# Patient Record
Sex: Male | Born: 1960 | Race: White | Hispanic: No | Marital: Married | Smoking: Former smoker
Health system: Southern US, Community
[De-identification: ages and names within clinical notes are randomized; demographics above are authoritative.]

## PROBLEM LIST (undated history)

## (undated) DIAGNOSIS — I10 Essential (primary) hypertension: Secondary | ICD-10-CM

## (undated) HISTORY — PX: REPAIR OF PERFORATED ULCER: SHX6065

## (undated) HISTORY — DX: Essential (primary) hypertension: I10

---

## 2012-09-11 ENCOUNTER — Ambulatory Visit: Payer: Self-pay | Admitting: Orthopedic Surgery

## 2014-07-04 DIAGNOSIS — E782 Mixed hyperlipidemia: Secondary | ICD-10-CM | POA: Insufficient documentation

## 2014-07-04 DIAGNOSIS — I10 Essential (primary) hypertension: Secondary | ICD-10-CM | POA: Insufficient documentation

## 2015-06-05 ENCOUNTER — Encounter: Payer: Self-pay | Admitting: Family Medicine

## 2015-06-05 ENCOUNTER — Ambulatory Visit (INDEPENDENT_AMBULATORY_CARE_PROVIDER_SITE_OTHER): Payer: BLUE CROSS/BLUE SHIELD | Admitting: Family Medicine

## 2015-06-05 VITALS — BP 142/80 | HR 88 | Temp 102.4°F | Ht 71.0 in | Wt 204.0 lb

## 2015-06-05 DIAGNOSIS — J029 Acute pharyngitis, unspecified: Secondary | ICD-10-CM

## 2015-06-05 LAB — POCT INFLUENZA A/B
Influenza A, POC: NEGATIVE
Influenza B, POC: NEGATIVE

## 2015-06-05 LAB — POCT RAPID STREP A (OFFICE): Rapid Strep A Screen: NEGATIVE

## 2015-06-05 MED ORDER — AMOXICILLIN-POT CLAVULANATE 875-125 MG PO TABS: 1.0000 | ORAL_TABLET | Freq: Two times a day (BID) | ORAL | Status: DC

## 2015-06-05 NOTE — Progress Notes (Signed)
Name: Derek Steele   MRN: [id number]    DOB: [birth year]   Date:06/05/2015       Progress Note  Subjective  Chief Complaint  Chief Complaint  Patient presents with  . Sore Throat    headache, fever- 101.2, chills, body aches x 2 days- can't eat, nauseated    Sore Throat  This is a new problem. The current episode started in the past 7 days. The problem has been waxing and waning. The maximum temperature recorded prior to his arrival was 101 - 101.9 F. Associated symptoms include congestion, coughing, headaches, a hoarse voice, neck pain, swollen glands and vomiting. Pertinent negatives include no abdominal pain, diarrhea, ear discharge, ear pain, shortness of breath or trouble swallowing. He has had no exposure to strep. He has tried acetaminophen for the symptoms. The treatment provided no relief.    No problem-specific assessment & plan notes found for this encounter.   No past medical history on file.  No past surgical history on file.  No family history on file.  Social History   Social History  . Marital Status: Married    Spouse Name: N/A  . Number of Children: N/A  . Years of Education: N/A   Occupational History  . Not on file.   Social History Main Topics  . Smoking status: Former Research scientist (life sciences)  . Smokeless tobacco: Not on file  . Alcohol Use: No  . Drug Use: No  . Sexual Activity: Not on file   Other Topics Concern  . Not on file   Social History Narrative  . No narrative on file    No Known Allergies   Review of Systems  Constitutional: Negative for fever, chills, weight loss and malaise/fatigue.  HENT: Positive for congestion, hoarse voice and sore throat. Negative for ear discharge, ear pain and trouble swallowing.   Eyes: Negative for blurred vision.  Respiratory: Positive for cough. Negative for sputum production, shortness of breath and wheezing.   Cardiovascular: Negative for chest pain, palpitations and leg swelling.  Gastrointestinal: Positive  for vomiting. Negative for heartburn, nausea, abdominal pain, diarrhea, constipation, blood in stool and melena.  Genitourinary: Negative for dysuria, urgency, frequency and hematuria.  Musculoskeletal: Positive for neck pain. Negative for myalgias, back pain and joint pain.  Skin: Negative for rash.  Neurological: Positive for headaches. Negative for dizziness, tingling, sensory change and focal weakness.  Endo/Heme/Allergies: Negative for environmental allergies and polydipsia. Does not bruise/bleed easily.  Psychiatric/Behavioral: Negative for depression and suicidal ideas. The patient is not nervous/anxious and does not have insomnia.      Objective  Filed Vitals:   06/05/15 0928  BP: 142/80  Pulse: 88  Temp: 102.4 F (39.1 C)  TempSrc: Oral  Weight: 204 lb (92.534 kg)    Physical Exam  Constitutional: He is oriented to person, place, and time and well-developed, well-nourished, and in no distress.  HENT:  Head: Normocephalic.  Right Ear: External ear normal.  Left Ear: External ear normal.  Nose: Nose normal.  Mouth/Throat: Oropharynx is clear and moist.  Eyes: Conjunctivae and EOM are normal. Pupils are equal, round, and reactive to light. Right eye exhibits no discharge. Left eye exhibits no discharge. No scleral icterus.  Neck: Normal range of motion. Neck supple. No JVD present. No tracheal deviation present. No thyromegaly present.  Cardiovascular: Normal rate, regular rhythm, normal heart sounds and intact distal pulses.  Exam reveals no gallop and no friction rub.   No murmur heard. Pulmonary/Chest: Breath sounds normal.  No respiratory distress. He has no wheezes. He has no rales.  Abdominal: Soft. Bowel sounds are normal. He exhibits no mass. There is no hepatosplenomegaly. There is no tenderness. There is no rebound, no guarding and no CVA tenderness.  Musculoskeletal: Normal range of motion. He exhibits no edema or tenderness.  Lymphadenopathy:       Head (right  side): Submental and submandibular adenopathy present.       Head (left side): Submental and submandibular adenopathy present.    He has no cervical adenopathy.  Neurological: He is alert and oriented to person, place, and time. He has normal sensation, normal strength, normal reflexes and intact cranial nerves. No cranial nerve deficit.  Skin: Skin is warm. No rash noted.  Psychiatric: Mood and affect normal.  Nursing note and vitals reviewed.     Assessment & Plan  Problem List Items Addressed This Visit    None    Visit Diagnoses    Pharyngitis    -  Primary    Relevant Medications    amoxicillin-clavulanate (AUGMENTIN) 875-125 MG tablet    Other Relevant Orders    POCT Influenza A/B (Completed)    POCT rapid strep A (Completed)         Dr. [facility]  06/05/2015

## 2015-08-20 ENCOUNTER — Encounter: Payer: Self-pay | Admitting: Family Medicine

## 2015-08-20 ENCOUNTER — Ambulatory Visit (INDEPENDENT_AMBULATORY_CARE_PROVIDER_SITE_OTHER): Payer: BLUE CROSS/BLUE SHIELD | Admitting: Family Medicine

## 2015-08-20 VITALS — BP 130/100 | HR 80 | Temp 98.1°F | Ht 71.0 in | Wt 205.0 lb

## 2015-08-20 DIAGNOSIS — J0100 Acute maxillary sinusitis, unspecified: Secondary | ICD-10-CM | POA: Diagnosis not present

## 2015-08-20 MED ORDER — AMOXICILLIN-POT CLAVULANATE 875-125 MG PO TABS
1.0000 | ORAL_TABLET | Freq: Two times a day (BID) | ORAL | Status: DC
Start: 2015-08-20 — End: 2015-09-22

## 2015-08-20 NOTE — Progress Notes (Signed)
Name: Derek Steele   MRN: [id number]    DOB: 1960-06-30   Date:08/20/2015       Progress Note  Subjective  Chief Complaint  Chief Complaint  Patient presents with  . Sinusitis    headache, sore throat    Sinusitis This is a new problem. The current episode started in the past 7 days. The problem has been waxing and waning since onset. There has been no fever. Pertinent negatives include no chills, congestion, coughing, diaphoresis, ear pain, headaches, hoarse voice, neck pain, shortness of breath, sinus pressure, sneezing, sore throat or swollen glands. Past treatments include acetaminophen.    No problem-specific assessment & plan notes found for this encounter.   Past Medical History  Diagnosis Date  . Hypertension     History reviewed. No pertinent past surgical history.  History reviewed. No pertinent family history.  Social History   Social History  . Marital Status: Married    Spouse Name: N/A  . Number of Children: N/A  . Years of Education: N/A   Occupational History  . Not on file.   Social History Main Topics  . Smoking status: Former Research scientist (life sciences)  . Smokeless tobacco: Not on file  . Alcohol Use: No  . Drug Use: No  . Sexual Activity: Not on file   Other Topics Concern  . Not on file   Social History Narrative    No Known Allergies   Review of Systems  Constitutional: Negative for fever, chills, weight loss, malaise/fatigue and diaphoresis.  HENT: Negative for congestion, ear discharge, ear pain, hoarse voice, sinus pressure, sneezing and sore throat.   Eyes: Negative for blurred vision.  Respiratory: Negative for cough, sputum production, shortness of breath and wheezing.   Cardiovascular: Negative for chest pain, palpitations and leg swelling.  Gastrointestinal: Negative for heartburn, nausea, abdominal pain, diarrhea, constipation, blood in stool and melena.  Genitourinary: Negative for dysuria, urgency, frequency and hematuria.   Musculoskeletal: Negative for myalgias, back pain, joint pain and neck pain.  Skin: Negative for rash.  Neurological: Negative for dizziness, tingling, sensory change, focal weakness and headaches.  Endo/Heme/Allergies: Negative for environmental allergies and polydipsia. Does not bruise/bleed easily.  Psychiatric/Behavioral: Negative for depression and suicidal ideas. The patient is not nervous/anxious and does not have insomnia.      Objective  Filed Vitals:   08/20/15 0827  BP: 130/100  Pulse: 80  Temp: 98.1 F (36.7 C)  TempSrc: Oral  Height: 5\' 11"  (1.803 m)  Weight: 205 lb (92.987 kg)    Physical Exam  Constitutional: He is oriented to person, place, and time and well-developed, well-nourished, and in no distress.  HENT:  Head: Normocephalic.  Right Ear: External ear normal.  Left Ear: External ear normal.  Nose: Right sinus exhibits no maxillary sinus tenderness and no frontal sinus tenderness. Left sinus exhibits maxillary sinus tenderness. Left sinus exhibits no frontal sinus tenderness.  Mouth/Throat: Posterior oropharyngeal erythema present.  Eyes: Conjunctivae and EOM are normal. Pupils are equal, round, and reactive to light. Right eye exhibits no discharge. Left eye exhibits no discharge. No scleral icterus.  Neck: Normal range of motion. Neck supple. No JVD present. No tracheal deviation present. No thyromegaly present.  Cardiovascular: Normal rate, regular rhythm, normal heart sounds and intact distal pulses.  Exam reveals no gallop and no friction rub.   No murmur heard. Pulmonary/Chest: Breath sounds normal. No respiratory distress. He has no wheezes. He has no rales.  Abdominal: Soft. Bowel sounds are normal. He  exhibits no mass. There is no hepatosplenomegaly. There is no tenderness. There is no rebound, no guarding and no CVA tenderness.  Musculoskeletal: Normal range of motion. He exhibits no edema or tenderness.  Lymphadenopathy:    He has no cervical  adenopathy.  Neurological: He is alert and oriented to person, place, and time. He has normal sensation, normal strength, normal reflexes and intact cranial nerves. No cranial nerve deficit.  Skin: Skin is warm. No rash noted.  Psychiatric: Mood and affect normal.  Nursing note and vitals reviewed.     Assessment & Plan  Problem List Items Addressed This Visit    None    Visit Diagnoses    Acute maxillary sinusitis, recurrence not specified    -  Primary    Relevant Medications    amoxicillin-clavulanate (AUGMENTIN) 875-125 MG tablet         Dr. [facility]  08/20/2015

## 2015-08-29 ENCOUNTER — Ambulatory Visit (INDEPENDENT_AMBULATORY_CARE_PROVIDER_SITE_OTHER): Payer: BLUE CROSS/BLUE SHIELD | Admitting: Family Medicine

## 2015-08-29 ENCOUNTER — Encounter: Payer: Self-pay | Admitting: Family Medicine

## 2015-08-29 VITALS — BP 152/100 | HR 82 | Temp 98.1°F | Resp 16 | Ht 71.0 in | Wt 203.0 lb

## 2015-08-29 DIAGNOSIS — Z23 Encounter for immunization: Secondary | ICD-10-CM

## 2015-08-29 DIAGNOSIS — Z0000 Encounter for general adult medical examination without abnormal findings: Secondary | ICD-10-CM | POA: Diagnosis not present

## 2015-08-29 DIAGNOSIS — I10 Essential (primary) hypertension: Secondary | ICD-10-CM | POA: Diagnosis not present

## 2015-08-29 DIAGNOSIS — R351 Nocturia: Secondary | ICD-10-CM

## 2015-08-29 DIAGNOSIS — Z1211 Encounter for screening for malignant neoplasm of colon: Secondary | ICD-10-CM

## 2015-08-29 DIAGNOSIS — R221 Localized swelling, mass and lump, neck: Secondary | ICD-10-CM

## 2015-08-29 DIAGNOSIS — E782 Mixed hyperlipidemia: Secondary | ICD-10-CM

## 2015-08-29 LAB — HEMOCCULT GUIAC POC 1CARD (OFFICE): FECAL OCCULT BLD: POSITIVE — AB

## 2015-08-29 LAB — POCT URINALYSIS DIPSTICK
BILIRUBIN UA: NEGATIVE
GLUCOSE UA: NEGATIVE
KETONES UA: NEGATIVE
Leukocytes, UA: NEGATIVE
NITRITE UA: NEGATIVE
PH UA: 7
Protein, UA: NEGATIVE
RBC UA: NEGATIVE
SPEC GRAV UA: 1.015
Urobilinogen, UA: 0.2

## 2015-08-29 MED ORDER — AMLODIPINE BESYLATE 5 MG PO TABS: 5.0000 mg | ORAL_TABLET | Freq: Every day | ORAL | Status: DC

## 2015-08-29 NOTE — Progress Notes (Signed)
Name: Derek Steele   MRN: [id number]    DOB: 19-Sep-1960   Date:08/29/2015       Progress Note  Subjective  Chief Complaint  Chief Complaint  Patient presents with  . Annual Exam    HPI Comments: Patient presents for annual physical exam.   No problem-specific assessment & plan notes found for this encounter.   Past Medical History  Diagnosis Date  . Hypertension     History reviewed. No pertinent past surgical history.  History reviewed. No pertinent family history.  Social History   Social History  . Marital Status: Married    Spouse Name: N/A  . Number of Children: N/A  . Years of Education: N/A   Occupational History  . Not on file.   Social History Main Topics  . Smoking status: Former Research scientist (life sciences)  . Smokeless tobacco: Not on file  . Alcohol Use: No  . Drug Use: No  . Sexual Activity: Not on file   Other Topics Concern  . Not on file   Social History Narrative    No Known Allergies   Review of Systems  Constitutional: Negative for fever, chills, weight loss and malaise/fatigue.  HENT: Negative for ear discharge, ear pain and sore throat.   Eyes: Negative for blurred vision.  Respiratory: Negative for cough, sputum production, shortness of breath and wheezing.   Cardiovascular: Negative for chest pain, palpitations and leg swelling.  Gastrointestinal: Negative for heartburn, nausea, abdominal pain, diarrhea, constipation, blood in stool and melena.  Genitourinary: Negative for dysuria, urgency, frequency and hematuria.  Musculoskeletal: Negative for myalgias, back pain, joint pain and neck pain.  Skin: Negative for rash.  Neurological: Negative for dizziness, tingling, sensory change, focal weakness and headaches.  Endo/Heme/Allergies: Negative for environmental allergies and polydipsia. Does not bruise/bleed easily.  Psychiatric/Behavioral: Negative for depression and suicidal ideas. The patient is not nervous/anxious and does not have insomnia.       Objective  Filed Vitals:   08/29/15 1011 08/29/15 1045  BP: 148/108 152/100  Pulse: 82   Temp: 98.1 F (36.7 C)   TempSrc: Oral   Resp: 16   Height: 5\' 11"  (1.803 m)   Weight: 203 lb (92.08 kg)     Physical Exam  Constitutional: He is oriented to person, place, and time and well-developed, well-nourished, and in no distress. Vital signs are normal.  HENT:  Head: Normocephalic.  Right Ear: Tympanic membrane, external ear and ear canal normal.  Left Ear: Tympanic membrane, external ear and ear canal normal.  Nose: Nose normal.  Mouth/Throat: Uvula is midline, oropharynx is clear and moist and mucous membranes are normal.  Eyes: Conjunctivae, EOM and lids are normal. Pupils are equal, round, and reactive to light. Right eye exhibits no discharge. Left eye exhibits no discharge. No scleral icterus.  Fundoscopic exam:      The right eye shows no arteriolar narrowing, no AV nicking and no papilledema.       The left eye shows no arteriolar narrowing, no AV nicking and no papilledema.  Neck: Trachea normal and normal range of motion. Neck supple. Normal carotid pulses, no hepatojugular reflux and no JVD present. Carotid bruit is not present. No rigidity. No tracheal deviation, no edema, no erythema and normal range of motion present. No thyroid mass and no thyromegaly present.    Palpable mass right submandibular  Cardiovascular: Normal rate, regular rhythm, S1 normal, S2 normal, normal heart sounds and intact distal pulses.  PMI is not displaced.  Exam  reveals no gallop and no friction rub.   No murmur heard. Pulmonary/Chest: Breath sounds normal. No respiratory distress. He has no wheezes. He has no rales.  Abdominal: Soft. Bowel sounds are normal. He exhibits no mass. There is no hepatosplenomegaly. There is no tenderness. There is no rebound, no guarding and no CVA tenderness.  Musculoskeletal: Normal range of motion. He exhibits no edema or tenderness.  Lymphadenopathy:        Head (right side): No submandibular adenopathy present.       Head (left side): No submandibular adenopathy present.    He has no cervical adenopathy.  Neurological: He is alert and oriented to person, place, and time. He has normal motor skills, normal sensation, normal strength, normal reflexes and intact cranial nerves. No cranial nerve deficit.  Skin: Skin is warm, dry and intact. No rash noted.  Psychiatric: Mood and affect normal.  Nursing note and vitals reviewed.     Assessment & Plan  Problem List Items Addressed This Visit      Cardiovascular and Mediastinum   Essential hypertension   Relevant Medications   amLODipine (NORVASC) 5 MG tablet   Other Relevant Orders   Renal Function Panel   POCT urinalysis dipstick (Completed)     Other   Combined fat and carbohydrate induced hyperlipemia   Relevant Medications   amLODipine (NORVASC) 5 MG tablet   Other Relevant Orders   Lipid Profile   Annual physical exam - Primary   Relevant Orders   Lipid Profile   Renal Function Panel   POCT Occult Blood Stool (Completed)   POCT urinalysis dipstick (Completed)    Other Visit Diagnoses    Nocturia        Relevant Orders    PSA    Colon cancer screening        Relevant Orders    Ambulatory referral to Gastroenterology    Neck mass        Relevant Orders    Ambulatory referral to ENT         Dr. Otilio Miu [facility]  08/29/2015

## 2015-08-30 LAB — LIPID PANEL
Chol/HDL Ratio: 4.6 ratio units (ref 0.0–5.0)
Cholesterol, Total: 208 mg/dL — ABNORMAL HIGH (ref 100–199)
HDL: 45 mg/dL (ref >39)
LDL Calculated: 134 mg/dL — ABNORMAL HIGH (ref 0–99)
Triglycerides: 146 mg/dL (ref 0–149)
VLDL Cholesterol Cal: 29 mg/dL (ref 5–40)

## 2015-08-30 LAB — RENAL FUNCTION PANEL
ALBUMIN: 4.4 g/dL (ref 3.5–5.5)
BUN/Creatinine Ratio: 11 (ref 9–20)
BUN: 10 mg/dL (ref 6–24)
CO2: 25 mmol/L (ref 18–29)
Calcium: 9.1 mg/dL (ref 8.7–10.2)
Chloride: 100 mmol/L (ref 96–106)
Creatinine, Ser: 0.88 mg/dL (ref 0.76–1.27)
GFR calc Af Amer: 113 mL/min/{1.73_m2} (ref >59)
GFR, EST NON AFRICAN AMERICAN: 97 mL/min/{1.73_m2} (ref >59)
GLUCOSE: 78 mg/dL (ref 65–99)
POTASSIUM: 4.2 mmol/L (ref 3.5–5.2)
Phosphorus: 3.3 mg/dL (ref 2.5–4.5)
Sodium: 142 mmol/L (ref 134–144)

## 2015-08-30 LAB — PSA: Prostate Specific Ag, Serum: 0.7 ng/mL (ref 0.0–4.0)

## 2015-09-05 ENCOUNTER — Other Ambulatory Visit: Payer: Self-pay | Admitting: Otolaryngology

## 2015-09-05 DIAGNOSIS — R221 Localized swelling, mass and lump, neck: Secondary | ICD-10-CM

## 2015-09-05 DIAGNOSIS — D170 Benign lipomatous neoplasm of skin and subcutaneous tissue of head, face and neck: Secondary | ICD-10-CM | POA: Diagnosis not present

## 2015-09-10 ENCOUNTER — Ambulatory Visit: Payer: Self-pay

## 2015-09-14 DIAGNOSIS — I10 Essential (primary) hypertension: Secondary | ICD-10-CM | POA: Diagnosis not present

## 2015-09-14 DIAGNOSIS — J029 Acute pharyngitis, unspecified: Secondary | ICD-10-CM | POA: Diagnosis not present

## 2015-09-15 ENCOUNTER — Ambulatory Visit
Admission: RE | Admit: 2015-09-15 | Discharge: 2015-09-15 | Disposition: A | Discharge status: Home / Self Care | Payer: BLUE CROSS/BLUE SHIELD | Source: Ambulatory Visit | Attending: Otolaryngology | Admitting: Otolaryngology

## 2015-09-15 DIAGNOSIS — R221 Localized swelling, mass and lump, neck: Secondary | ICD-10-CM | POA: Diagnosis not present

## 2015-09-21 DIAGNOSIS — K1370 Unspecified lesions of oral mucosa: Secondary | ICD-10-CM | POA: Diagnosis not present

## 2015-09-22 ENCOUNTER — Encounter: Payer: Self-pay | Admitting: Family Medicine

## 2015-09-22 ENCOUNTER — Ambulatory Visit (INDEPENDENT_AMBULATORY_CARE_PROVIDER_SITE_OTHER): Payer: BLUE CROSS/BLUE SHIELD | Admitting: Family Medicine

## 2015-09-22 VITALS — BP 130/100 | HR 70 | Ht 71.0 in | Wt 207.0 lb

## 2015-09-22 DIAGNOSIS — K121 Other forms of stomatitis: Secondary | ICD-10-CM | POA: Diagnosis not present

## 2015-09-22 MED ORDER — FLUCONAZOLE 150 MG PO TABS: 150.0000 mg | ORAL_TABLET | Freq: Once | ORAL | Status: DC

## 2015-09-22 MED ORDER — VALACYCLOVIR HCL 1 G PO TABS: 1000.0000 mg | ORAL_TABLET | Freq: Two times a day (BID) | ORAL | Status: DC

## 2015-09-22 NOTE — Progress Notes (Signed)
Name: Derek Steele   MRN: [id number]    DOB: January 12, 1961   Date:09/22/2015       Progress Note  Subjective  Chief Complaint  Chief Complaint  Patient presents with  . Follow-up    went to [facility] in [place] for mouth sores- was put on taper of prednisone- first two days seemed to help, afterwards got worse. Went back to Urgent Care- was told "can't do anything about it", leaving to go out of town this Friday- still on Prednisone and mixing Lidocaine and Nystatin suspension as directed.    Sore Throat  This is a recurrent problem. The current episode started 1 to 4 weeks ago. The problem has been waxing and waning. There has been no fever. The pain is mild. Associated symptoms include trouble swallowing. Pertinent negatives include no abdominal pain, congestion, coughing, diarrhea, drooling, ear discharge, ear pain, headaches, hoarse voice, plugged ear sensation, neck pain, shortness of breath, stridor, swollen glands or vomiting. He has tried acetaminophen and gargles for the symptoms. The treatment provided mild (initially on prednisone) relief.    No problem-specific assessment & plan notes found for this encounter.   Past Medical History  Diagnosis Date  . Hypertension     History reviewed. No pertinent past surgical history.  History reviewed. No pertinent family history.  Social History   Social History  . Marital Status: Married    Spouse Name: N/A  . Number of Children: N/A  . Years of Education: N/A   Occupational History  . Not on file.   Social History Main Topics  . Smoking status: Former Research scientist (life sciences)  . Smokeless tobacco: Not on file  . Alcohol Use: No  . Drug Use: No  . Sexual Activity: Not on file   Other Topics Concern  . Not on file   Social History Narrative    No Known Allergies   Review of Systems  Constitutional: Negative for fever, chills, weight loss and malaise/fatigue.  HENT: Positive for trouble swallowing. Negative for congestion,  drooling, ear discharge, ear pain, hoarse voice and sore throat.   Eyes: Negative for blurred vision.  Respiratory: Negative for cough, sputum production, shortness of breath, wheezing and stridor.   Cardiovascular: Negative for chest pain, palpitations and leg swelling.  Gastrointestinal: Negative for heartburn, nausea, vomiting, abdominal pain, diarrhea, constipation, blood in stool and melena.  Genitourinary: Negative for dysuria, urgency, frequency and hematuria.  Musculoskeletal: Negative for myalgias, back pain, joint pain and neck pain.  Skin: Negative for rash.  Neurological: Negative for dizziness, tingling, sensory change, focal weakness and headaches.  Endo/Heme/Allergies: Negative for environmental allergies and polydipsia. Does not bruise/bleed easily.  Psychiatric/Behavioral: Negative for depression and suicidal ideas. The patient is not nervous/anxious and does not have insomnia.      Objective  Filed Vitals:   09/22/15 0956  BP: 130/100  Pulse: 70  Height: 5\' 11"  (1.803 m)  Weight: 207 lb (93.895 kg)    Physical Exam  Constitutional: He is oriented to person, place, and time and well-developed, well-nourished, and in no distress.  HENT:  Head: Normocephalic.  Right Ear: External ear normal.  Left Ear: External ear normal.  Nose: Nose normal.  Mouth/Throat: Oral lesions present. Posterior oropharyngeal erythema present.  Eyes: Conjunctivae and EOM are normal. Pupils are equal, round, and reactive to light. Right eye exhibits no discharge. Left eye exhibits no discharge. No scleral icterus.  Neck: Normal range of motion. Neck supple. No JVD present. No tracheal deviation present. No thyromegaly  present.  Cardiovascular: Normal rate, regular rhythm, normal heart sounds and intact distal pulses.  Exam reveals no gallop and no friction rub.   No murmur heard. Pulmonary/Chest: Breath sounds normal. No respiratory distress. He has no wheezes. He has no rales.  Abdominal:  Soft. Bowel sounds are normal. He exhibits no mass. There is no hepatosplenomegaly. There is no tenderness. There is no rebound, no guarding and no CVA tenderness.  Musculoskeletal: Normal range of motion. He exhibits no edema or tenderness.  Lymphadenopathy:    He has no cervical adenopathy.  Neurological: He is alert and oriented to person, place, and time. He has normal sensation, normal strength, normal reflexes and intact cranial nerves. No cranial nerve deficit.  Skin: Skin is warm. No rash noted.  Psychiatric: Mood and affect normal.  Nursing note and vitals reviewed.     Assessment & Plan  Problem List Items Addressed This Visit    None    Visit Diagnoses    Stomatitis    -  Primary    Relevant Medications    fluconazole (DIFLUCAN) 150 MG tablet    valACYclovir (VALTREX) 1000 MG tablet    Other Relevant Orders    HSV 1 antibody, IgG         Dr. Eidan [facility]  09/22/2015

## 2015-09-22 NOTE — Patient Instructions (Signed)
Stomatitis Stomatitis is a condition that causes inflammation in your mouth. It can affect a part of your mouth or your whole mouth. The condition often affects your cheek, teeth, gums, lips, and tongue. Stomatitis can also affect the mucous membranes that surround your mouth (mucosa). Pain from stomatitis can make it hard for you to eat or drink. Severe cases of this condition can lead to dehydration or poor nutrition. CAUSES Common causes of this condition include:  Viruses, such as cold sores or oral herpes and shingles.  Canker sores.  Bacterial infections.  Fungus or yeast infections, such as oral thrush.  Not getting adequate nutrition.  Injury to your mouth. This can be from:  Dentures or braces that do not fit well.  Biting your tongue or cheek.  Burning your mouth.  Having sharp or broken teeth.  Gum disease.  Using tobacco, especially chewing tobacco.  Allergies to foods, medicines, or substances that are used in your mouth.  Medicines, including cancer medicines (chemotherapy), antihistamines, and seizure medicines. In some cases, the cause may not be known. RISK FACTORS This condition is more likely to develop in people who:  Have poor oral hygiene or poor nutrition.  Have any condition that causes a dry mouth.  Are under a lot of physical or emotional stress.  Have any condition that weakens the body's defense system (immune system).  Are being treated for cancer.  Smoke. SYMPTOMS The most common symptoms of this condition are pain, swelling, and redness inside your mouth. The pain may feel like burning or stinging. It may get worse from eating or drinking. Other symptoms include:  Painful, shallow sores (ulcers) in the mouth.  Blisters in the mouth.  Bleeding gums.  Swollen gums.  Irritability and fatigue.  Bad breath.  Bad taste in the mouth.  Fever. DIAGNOSIS This condition is diagnosed with a physical exam to check for bleeding gums  and mouth ulcers. You may also have other tests, including:  Blood tests to look for infection or vitamin deficiencies.  Mouth swab to get a fluid sample to test for bacteria (culture).  Tissue sample from an ulcer to examine under a microscope (biopsy). TREATMENT Treatment for stomatitis depends on the cause. Treatment may include medicines, such as:  Over-the counter (OTC) pain medicines.  Topical anesthetic to numb the area if you have severe pain.  Antibiotics to treat a bacterial infection.  Antifungals to treat a fungal infection.  Antivirals to treat a viral infection.  Mouth rinses that contain steroids to reduce the swelling in your mouth.  Other medicines to coat or numb your mouth. HOME CARE INSTRUCTIONS Medicines  Take medicines only as directed by your health care provider.  If you were prescribed an antibiotic, finish all of it even if you start to feel better. Lifestyle  Practice good oral hygiene:  Gently brush your teeth with a soft, nylon-bristled toothbrush two times each day.  Floss your teeth every day.  Have your teeth cleaned regularly, as recommended by your dentist.  Eat a balanced diet.Do not eat:  Spicy foods.  Citrus, such as oranges.  Foods that have sharp edges, such as chips.  Avoid any foods or other allergens that you think may be causing your stomatitis.  If you have dentures, make sure that they are properly fitted.  Do not use any tobacco products, including cigarettes, chewing tobacco, or electronic cigarettes. If you need help quitting, ask your health care provider.  Find ways to reduce stress. Try yoga  or meditation. Ask your health care provider for other ideas. General Instructions  Use a salt-water rinse for pain as directed by your health care provider. Mix 1 tsp of salt in 2 cups of water.  Drink enough fluid to keep your urine clear or pale yellow. This will keep you hydrated. SEEK MEDICAL CARE IF:  Your  symptoms get worse.  You develop new symptoms, especially:  A rash.  New symptoms that do not involve your mouth area.  Your symptoms last longer than three weeks.  Your stomatitis goes away and then returns.  You have a harder time eating and drinking normally.  You have increasing fatigue or weakness.  You lose your appetite or you feel nauseous.  You have a fever.   This information is not intended to replace advice given to you by your health care provider. Make sure you discuss any questions you have with your health care provider.   Document Released: 01/17/2007 Document Revised: 08/06/2014 Document Reviewed: 03/18/2014 Elsevier Interactive Patient Education [employer].

## 2015-09-23 LAB — HSV 1 ANTIBODY, IGG: HSV 1 Glycoprotein G Ab, IgG: 43.8 index — ABNORMAL HIGH (ref 0.00–0.90)

## 2015-10-03 ENCOUNTER — Telehealth: Payer: Self-pay

## 2015-10-03 ENCOUNTER — Other Ambulatory Visit: Payer: Self-pay

## 2015-10-03 NOTE — Telephone Encounter (Signed)
Gastroenterology Pre-Procedure Review  Request Date: 10/31/15 Requesting Physician: Dr. Ronnald Ramp  PATIENT REVIEW QUESTIONS: The patient responded to the following health history questions as indicated:    1. Are you having any GI issues? no 2. Do you have a personal history of Polyps? no 3. Do you have a family history of Colon Cancer or Polyps? no 4. Diabetes Mellitus? no 5. Joint replacements in the past 12 months?no 6. Major health problems in the past 3 months?no 7. Any artificial heart valves, MVP, or defibrillator?no    MEDICATIONS & ALLERGIES:    Patient reports the following regarding taking any anticoagulation/antiplatelet therapy:   Plavix, Coumadin, Eliquis, Xarelto, Lovenox, Pradaxa, Brilinta, or Effient? no Aspirin? no  Patient confirms/reports the following medications:  Current Outpatient Prescriptions  Medication Sig Dispense Refill  . amLODipine (NORVASC) 5 MG tablet Take 1 tablet (5 mg total) by mouth daily. 30 tablet 1  . fluconazole (DIFLUCAN) 150 MG tablet Take 1 tablet (150 mg total) by mouth once. 2 tablet 0  . losartan (COZAAR) 100 MG tablet Take 100 mg by mouth daily. Dr Nehemiah Massed  1  . predniSONE (DELTASONE) 20 MG tablet     . valACYclovir (VALTREX) 1000 MG tablet Take 1 tablet (1,000 mg total) by mouth 2 (two) times daily. 20 tablet 0   No current facility-administered medications for this visit.    Patient confirms/reports the following allergies:  No Known Allergies  No orders of the defined types were placed in this encounter.    AUTHORIZATION INFORMATION Primary Insurance: 1D#: Group #:  Secondary Insurance: 1D#: Group #:  SCHEDULE INFORMATION: Date: 10/31/15 Time: Location: [place]

## 2015-10-24 ENCOUNTER — Encounter: Payer: Self-pay | Admitting: *Deleted

## 2015-10-27 NOTE — Discharge Instructions (Signed)

## 2015-10-31 ENCOUNTER — Ambulatory Visit
Admission: RE | Admit: 2015-10-31 | Discharge: 2015-10-31 | Disposition: A | Discharge status: Home / Self Care | Payer: BLUE CROSS/BLUE SHIELD | Source: Ambulatory Visit | Attending: Gastroenterology | Admitting: Gastroenterology

## 2015-10-31 ENCOUNTER — Ambulatory Visit: Payer: BLUE CROSS/BLUE SHIELD | Admitting: Anesthesiology

## 2015-10-31 ENCOUNTER — Encounter
Admission: RE | Disposition: A | Discharge status: Home / Self Care | Payer: Self-pay | Source: Ambulatory Visit | Attending: Gastroenterology

## 2015-10-31 DIAGNOSIS — Z87891 Personal history of nicotine dependence: Secondary | ICD-10-CM | POA: Insufficient documentation

## 2015-10-31 DIAGNOSIS — D120 Benign neoplasm of cecum: Secondary | ICD-10-CM | POA: Diagnosis not present

## 2015-10-31 DIAGNOSIS — Z79899 Other long term (current) drug therapy: Secondary | ICD-10-CM | POA: Diagnosis not present

## 2015-10-31 DIAGNOSIS — K5730 Diverticulosis of large intestine without perforation or abscess without bleeding: Secondary | ICD-10-CM | POA: Diagnosis not present

## 2015-10-31 DIAGNOSIS — D124 Benign neoplasm of descending colon: Secondary | ICD-10-CM

## 2015-10-31 DIAGNOSIS — K641 Second degree hemorrhoids: Secondary | ICD-10-CM | POA: Diagnosis not present

## 2015-10-31 DIAGNOSIS — I10 Essential (primary) hypertension: Secondary | ICD-10-CM | POA: Insufficient documentation

## 2015-10-31 DIAGNOSIS — K649 Unspecified hemorrhoids: Secondary | ICD-10-CM | POA: Diagnosis not present

## 2015-10-31 DIAGNOSIS — D125 Benign neoplasm of sigmoid colon: Secondary | ICD-10-CM | POA: Diagnosis not present

## 2015-10-31 DIAGNOSIS — K635 Polyp of colon: Secondary | ICD-10-CM | POA: Insufficient documentation

## 2015-10-31 DIAGNOSIS — D122 Benign neoplasm of ascending colon: Secondary | ICD-10-CM

## 2015-10-31 DIAGNOSIS — Z1211 Encounter for screening for malignant neoplasm of colon: Secondary | ICD-10-CM

## 2015-10-31 DIAGNOSIS — K5790 Diverticulosis of intestine, part unspecified, without perforation or abscess without bleeding: Secondary | ICD-10-CM | POA: Diagnosis not present

## 2015-10-31 HISTORY — PX: COLONOSCOPY WITH PROPOFOL: SHX5780

## 2015-10-31 HISTORY — PX: POLYPECTOMY: SHX5525

## 2015-10-31 SURGERY — COLONOSCOPY WITH PROPOFOL
Anesthesia: Monitor Anesthesia Care | Site: Rectum | Wound class: Contaminated

## 2015-10-31 MED ORDER — PROPOFOL 10 MG/ML IV BOLUS
INTRAVENOUS | Status: DC | PRN
  Administered 2015-10-31 (×6): 50 mg via INTRAVENOUS
  Administered 2015-10-31: 100 mg via INTRAVENOUS
  Administered 2015-10-31 (×3): 50 mg via INTRAVENOUS
  Administered 2015-10-31: 20 mg via INTRAVENOUS

## 2015-10-31 MED ORDER — OXYCODONE HCL 5 MG PO TABS: 5.0000 mg | ORAL_TABLET | Freq: Once | ORAL | Status: DC | PRN

## 2015-10-31 MED ORDER — LACTATED RINGERS IV SOLN
INTRAVENOUS | Status: DC
  Administered 2015-10-31: 08:00:00 via INTRAVENOUS

## 2015-10-31 MED ORDER — OXYCODONE HCL 5 MG/5ML PO SOLN: 5.0000 mg | Freq: Once | ORAL | Status: DC | PRN

## 2015-10-31 MED ORDER — LIDOCAINE HCL (CARDIAC) 20 MG/ML IV SOLN
INTRAVENOUS | Status: DC | PRN
  Administered 2015-10-31: 30 mg via INTRAVENOUS

## 2015-10-31 MED ORDER — STERILE WATER FOR IRRIGATION IR SOLN
Status: DC | PRN
  Administered 2015-10-31: 09:00:00

## 2015-10-31 SURGICAL SUPPLY — 23 items
CANISTER SUCT 1200ML W/VALVE (MISCELLANEOUS) ×3 IMPLANT
CLIP HMST 235XBRD CATH ROT (MISCELLANEOUS) IMPLANT
CLIP RESOLUTION 360 11X235 (MISCELLANEOUS)
FCP ESCP3.2XJMB 240X2.8X (MISCELLANEOUS)
FORCEPS BIOP RAD 4 LRG CAP 4 (CUTTING FORCEPS) ×3 IMPLANT
FORCEPS BIOP RJ4 240 W/NDL (MISCELLANEOUS)
FORCEPS ESCP3.2XJMB 240X2.8X (MISCELLANEOUS) IMPLANT
GOWN CVR UNV OPN BCK APRN NK (MISCELLANEOUS) ×2 IMPLANT
GOWN ISOL THUMB LOOP REG UNIV (MISCELLANEOUS) ×4
INJECTOR VARIJECT VIN23 (MISCELLANEOUS) IMPLANT
KIT DEFENDO VALVE AND CONN (KITS) IMPLANT
KIT ENDO PROCEDURE OLY (KITS) ×3 IMPLANT
MARKER SPOT ENDO TATTOO 5ML (MISCELLANEOUS) IMPLANT
PAD GROUND ADULT SPLIT (MISCELLANEOUS) IMPLANT
PROBE APC STR FIRE (PROBE) IMPLANT
RETRIEVER NET ROTH 2.5X230 LF (MISCELLANEOUS) ×3 IMPLANT
SNARE SHORT THROW 13M SML OVAL (MISCELLANEOUS) ×3 IMPLANT
SNARE SHORT THROW 30M LRG OVAL (MISCELLANEOUS) IMPLANT
SNARE SNG USE RND 15MM (INSTRUMENTS) IMPLANT
SPOT EX ENDOSCOPIC TATTOO (MISCELLANEOUS)
TRAP ETRAP POLY (MISCELLANEOUS) ×3 IMPLANT
VARIJECT INJECTOR VIN23 (MISCELLANEOUS)
WATER STERILE IRR 250ML POUR (IV SOLUTION) ×3 IMPLANT

## 2015-10-31 NOTE — Anesthesia Postprocedure Evaluation (Signed)
Anesthesia Post Note  Patient: Derek Steele  Procedure(s) Performed: Procedure(s) (LRB): COLONOSCOPY WITH PROPOFOL (N/A) POLYPECTOMY (N/A)  Patient location during evaluation: PACU Anesthesia Type: MAC Level of consciousness: awake and alert Pain management: pain level controlled Vital Signs Assessment: post-procedure vital signs reviewed and stable Respiratory status: spontaneous breathing, nonlabored ventilation, respiratory function stable and patient connected to nasal cannula oxygen Cardiovascular status: stable and blood pressure returned to baseline Anesthetic complications: no    Daryana Whirley

## 2015-10-31 NOTE — Op Note (Signed)
[facility] Gastroenterology Patient Name: Derek Steele Procedure Date: 10/31/2015 8:24 AM MRN: [id number] Account #: [id number] Date of Birth: 08-May-1960 Admit Type: Outpatient Age: 55 Room: [facility] OR ROOM 01 Gender: Male Note Status: Finalized Procedure:            Colonoscopy Indications:          Screening for colorectal malignant neoplasm Providers:            Lucilla Lame MD, MD Referring MD:         Juline Patch, MD (Referring MD) Medicines:            Propofol per Anesthesia Complications:        No immediate complications. Procedure:            Pre-Anesthesia Assessment:                       - Prior to the procedure, a History and Physical was                        performed, and patient medications and allergies were                        reviewed. The patient's tolerance of previous                        anesthesia was also reviewed. The risks and benefits of                        the procedure and the sedation options and risks were                        discussed with the patient. All questions were                        answered, and informed consent was obtained. Prior                        Anticoagulants: The patient has taken no previous                        anticoagulant or antiplatelet agents. ASA Grade                        Assessment: II - A patient with mild systemic disease.                        After reviewing the risks and benefits, the patient was                        deemed in satisfactory condition to undergo the                        procedure.                       After obtaining informed consent, the colonoscope was                        passed under direct vision. Throughout the procedure,  the patient's blood pressure, pulse, and oxygen                        saturations were monitored continuously. The was                        introduced through the anus and advanced to the the                 cecum, identified by appendiceal orifice and ileocecal                        valve. The colonoscopy was performed without                        difficulty. The patient tolerated the procedure well.                        The quality of the bowel preparation was good. Findings:      The perianal and digital rectal examinations were normal.      A 3 mm polyp was found in the cecum. The polyp was sessile. The polyp       was removed with a cold biopsy forceps. Resection and retrieval were       complete.      A 4 mm polyp was found in the ascending colon. The polyp was sessile.       The polyp was removed with a cold biopsy forceps. Resection and       retrieval were complete.      A 3 mm polyp was found in the ascending colon. The polyp was sessile.       The polyp was removed with a cold biopsy forceps. Resection and       retrieval were complete.      A 4 mm polyp was found in the descending colon. The polyp was sessile.       The polyp was removed with a cold biopsy forceps. Resection and       retrieval were complete.      Two pedunculated polyps were found in the sigmoid colon. The polyps were       5 to 7 mm in size. These polyps were removed with a cold snare.       Resection and retrieval were complete.      Multiple small-mouthed diverticula were found in the sigmoid colon.      Non-bleeding internal hemorrhoids were found during retroflexion. The       hemorrhoids were Grade II (internal hemorrhoids that prolapse but reduce       spontaneously). Impression:           - One 3 mm polyp in the cecum, removed with a cold                        biopsy forceps. Resected and retrieved.                       - One 4 mm polyp in the ascending colon, removed with a                        cold biopsy forceps. Resected and retrieved.                       -  One 3 mm polyp in the ascending colon, removed with a                        cold biopsy forceps. Resected and  retrieved.                       - One 4 mm polyp in the descending colon, removed with                        a cold biopsy forceps. Resected and retrieved.                       - Two 5 to 7 mm polyps in the sigmoid colon, removed                        with a cold snare. Resected and retrieved.                       - Diverticulosis in the sigmoid colon.                       - Non-bleeding internal hemorrhoids. Recommendation:       - Await pathology results.                       - Repeat colonoscopy in 5 years if polyp adenoma and 10                        years if hyperplastic Procedure Code(s):    --- Professional ---                       [phone], Colonoscopy, flexible; with removal of tumor(s),                        polyp(s), or other lesion(s) by snare technique                       45380, 39, Colonoscopy, flexible; with biopsy, single                        or multiple Diagnosis Code(s):    --- Professional ---                       Z12.11, Encounter for screening for malignant neoplasm                        of colon                       D12.0, Benign neoplasm of cecum                       D12.2, Benign neoplasm of ascending colon                       D12.4, Benign neoplasm of descending colon                       D12.5, Benign neoplasm of sigmoid colon CPT copyright 2016 American Medical Association. All rights reserved. The codes documented in this  report are preliminary and upon coder review may  be revised to meet current compliance requirements. Lucilla Lame MD, MD 10/31/2015 8:51:51 AM This report has been signed electronically. Number of Addenda: 0 Note Initiated On: 10/31/2015 8:24 AM Scope Withdrawal Time: 0 hours 12 minutes 40 seconds  Total Procedure Duration: 0 hours 17 minutes 32 seconds       [facility]

## 2015-10-31 NOTE — Anesthesia Procedure Notes (Signed)
Procedure Name: MAC Performed by: Marvetta Vohs Pre-anesthesia Checklist: Patient identified, Emergency Drugs available, Suction available, Timeout performed and Patient being monitored Patient Re-evaluated:Patient Re-evaluated prior to inductionOxygen Delivery Method: Nasal cannula Placement Confirmation: positive ETCO2     

## 2015-10-31 NOTE — Anesthesia Preprocedure Evaluation (Signed)
Anesthesia Evaluation  Patient identified by MRN, date of birth, ID band  Reviewed: NPO status   History of Anesthesia Complications Negative for: history of anesthetic complications  Airway Mallampati: II  TM Distance: >3 FB Neck ROM: full    Dental no notable dental hx.    Pulmonary neg pulmonary ROS, former smoker,    Pulmonary exam normal        Cardiovascular Exercise Tolerance: Good hypertension, Normal cardiovascular exam(-) pacemaker     Neuro/Psych negative neurological ROS  negative psych ROS   GI/Hepatic negative GI ROS, Neg liver ROS,   Endo/Other  negative endocrine ROS  Renal/GU negative Renal ROS  negative genitourinary   Musculoskeletal   Abdominal   Peds  Hematology negative hematology ROS (+)   Anesthesia Other Findings   Reproductive/Obstetrics                             Anesthesia Physical Anesthesia Plan  ASA: II  Anesthesia Plan: MAC   Post-op Pain Management:    Induction:   Airway Management Planned:   Additional Equipment:   Intra-op Plan:   Post-operative Plan:   Informed Consent: I have reviewed the patients History and Physical, chart, labs and discussed the procedure including the risks, benefits and alternatives for the proposed anesthesia with the patient or authorized representative who has indicated his/her understanding and acceptance.     Plan Discussed with: CRNA  Anesthesia Plan Comments:         Anesthesia Quick Evaluation

## 2015-10-31 NOTE — Transfer of Care (Signed)
Immediate Anesthesia Transfer of Care Note  Patient: Derek Steele  Procedure(s) Performed: Procedure(s) with comments: COLONOSCOPY WITH PROPOFOL (N/A) POLYPECTOMY (N/A) - sigmoid colon polyp x 2 cecal polyp acsending colon polyp x 2 transverse colon polyp   Patient Location: PACU  Anesthesia Type: MAC  Level of Consciousness: awake, alert  and patient cooperative  Airway and Oxygen Therapy: Patient Spontanous Breathing and Patient connected to supplemental oxygen  Post-op Assessment: Post-op Vital signs reviewed, Patient's Cardiovascular Status Stable, Respiratory Function Stable, Patent Airway and No signs of Nausea or vomiting  Post-op Vital Signs: Reviewed and stable  Complications: No apparent anesthesia complications

## 2015-10-31 NOTE — H&P (Signed)
  Lucilla Lame, MD [address] [place], [place] [address] Phone: [phone] Fax : [phone]  Primary Care Physician:  Otilio Miu, MD Primary Gastroenterologist:  Dr. Allen Norris  Pre-Procedure History & Physical: HPI:  AJAHNI BENZIGER is a 55 y.o. male is here for a screening colonoscopy.   Past Medical History:  Diagnosis Date  . Hypertension     Past Surgical History:  Procedure Laterality Date  . REPAIR OF PERFORATED ULCER  age: mid 20's    Prior to Admission medications   Medication Sig Start Date End Date Taking? Authorizing Provider  amLODipine (NORVASC) 5 MG tablet Take 1 tablet (5 mg total) by mouth daily. 08/29/15  Yes Juline Patch, MD  losartan (COZAAR) 100 MG tablet Take 100 mg by mouth daily. Dr Nehemiah Massed 05/11/15  Yes Historical Provider, MD  valACYclovir (VALTREX) 1000 MG tablet Take 1 tablet (1,000 mg total) by mouth 2 (two) times daily. 09/22/15  Yes Juline Patch, MD    Allergies as of 10/03/2015  . (No Known Allergies)    History reviewed. No pertinent family history.  Social History   Social History  . Marital status: Married    Spouse name: N/A  . Number of children: N/A  . Years of education: N/A   Occupational History  . Not on file.   Social History Main Topics  . Smoking status: Former Research scientist (life sciences)  . Smokeless tobacco: Former Systems developer    Quit date: 04/06/1995  . Alcohol use No     Comment: rare- holidays  . Drug use: No  . Sexual activity: Not on file   Other Topics Concern  . Not on file   Social History Narrative  . No narrative on file    Review of Systems: See HPI, otherwise negative ROS  Physical Exam: Ht 5\' 11"  (1.803 m)   Wt 207 lb (93.9 kg)   BMI 28.87 kg/m  General:   Alert,  pleasant and cooperative in NAD Head:  Normocephalic and atraumatic. Neck:  Supple; no masses or thyromegaly. Lungs:  Clear throughout to auscultation.    Heart:  Regular rate and rhythm. Abdomen:  Soft, nontender and nondistended.  Normal bowel sounds, without guarding, and without rebound.   Neurologic:  Alert and  oriented x4;  grossly normal neurologically.  Impression/Plan: ANTONNIO MCDAVID is now here to undergo a screening colonoscopy.  Risks, benefits, and alternatives regarding colonoscopy have been reviewed with the patient.  Questions have been answered.  All parties agreeable.

## 2015-11-03 ENCOUNTER — Encounter: Payer: Self-pay | Admitting: Gastroenterology

## 2015-11-04 ENCOUNTER — Encounter: Payer: Self-pay | Admitting: Gastroenterology

## 2015-11-05 ENCOUNTER — Encounter: Payer: Self-pay | Admitting: Gastroenterology

## 2015-12-02 ENCOUNTER — Ambulatory Visit (INDEPENDENT_AMBULATORY_CARE_PROVIDER_SITE_OTHER): Payer: BLUE CROSS/BLUE SHIELD | Admitting: Family Medicine

## 2015-12-02 ENCOUNTER — Encounter: Payer: Self-pay | Admitting: Family Medicine

## 2015-12-02 VITALS — BP 138/98 | HR 80 | Temp 98.7°F | Ht 71.0 in | Wt 213.0 lb

## 2015-12-02 DIAGNOSIS — J029 Acute pharyngitis, unspecified: Secondary | ICD-10-CM | POA: Diagnosis not present

## 2015-12-02 DIAGNOSIS — R5381 Other malaise: Secondary | ICD-10-CM | POA: Diagnosis not present

## 2015-12-02 DIAGNOSIS — R5383 Other fatigue: Secondary | ICD-10-CM | POA: Diagnosis not present

## 2015-12-02 MED ORDER — AMOXICILLIN-POT CLAVULANATE 875-125 MG PO TABS: 1.0000 | ORAL_TABLET | Freq: Two times a day (BID) | ORAL | 0 refills | Status: DC

## 2015-12-02 NOTE — Progress Notes (Signed)
Name: Derek Steele   MRN: [id number]    DOB: [birth year]   Date:12/02/2015       Progress Note  Subjective  Chief Complaint  Chief Complaint  Patient presents with  . Sinusitis    sore throat off and on, feel "foggy in my head", no cough or congestion    Sinusitis  This is a chronic problem. The current episode started more than 1 month ago. The problem has been gradually worsening since onset. There has been no fever. The pain is mild. Associated symptoms include congestion, headaches, a sore throat and swollen glands. Pertinent negatives include no chills, coughing, diaphoresis, ear pain, hoarse voice, neck pain, shortness of breath, sinus pressure or sneezing. Past treatments include antibiotics. The treatment provided mild relief.    No problem-specific Assessment & Plan notes found for this encounter.   Past Medical History:  Diagnosis Date  . Hypertension     Past Surgical History:  Procedure Laterality Date  . COLONOSCOPY WITH PROPOFOL N/A 10/31/2015   Procedure: COLONOSCOPY WITH PROPOFOL;  Surgeon: Lucilla Lame, MD;  Location: [facility];  Service: Endoscopy;  Laterality: N/A;  . POLYPECTOMY N/A 10/31/2015   Procedure: POLYPECTOMY;  Surgeon: Lucilla Lame, MD;  Location: [facility];  Service: Endoscopy;  Laterality: N/A;  sigmoid colon polyp x 2 cecal polyp acsending colon polyp x 2 transverse colon polyp   . REPAIR OF PERFORATED ULCER  age: mid 45's    History reviewed. No pertinent family history.  Social History   Social History  . Marital status: Married    Spouse name: N/A  . Number of children: N/A  . Years of education: N/A   Occupational History  . Not on file.   Social History Main Topics  . Smoking status: Former Research scientist (life sciences)  . Smokeless tobacco: Former Systems developer    Quit date: 04/06/1995  . Alcohol use No     Comment: rare- holidays  . Drug use: No  . Sexual activity: Yes   Other Topics Concern  . Not on file   Social History  Narrative  . No narrative on file    No Known Allergies   Review of Systems  Constitutional: Negative for chills, diaphoresis, fever, malaise/fatigue and weight loss.  HENT: Positive for congestion and sore throat. Negative for ear discharge, ear pain, hoarse voice, sinus pressure and sneezing.   Eyes: Negative for blurred vision.  Respiratory: Negative for cough, sputum production, shortness of breath and wheezing.   Cardiovascular: Negative for chest pain, palpitations and leg swelling.  Gastrointestinal: Negative for abdominal pain, blood in stool, constipation, diarrhea, heartburn, melena and nausea.  Genitourinary: Negative for dysuria, frequency, hematuria and urgency.  Musculoskeletal: Negative for back pain, joint pain, myalgias and neck pain.  Skin: Negative for rash.  Neurological: Positive for headaches. Negative for dizziness, tingling, sensory change and focal weakness.  Endo/Heme/Allergies: Negative for environmental allergies and polydipsia. Does not bruise/bleed easily.  Psychiatric/Behavioral: Negative for depression and suicidal ideas. The patient is not nervous/anxious and does not have insomnia.      Objective  Vitals:   12/02/15 0809  BP: (!) 138/98  Pulse: 80  Temp: 98.7 F (37.1 C)  TempSrc: Oral  Weight: 213 lb (96.6 kg)  Height: _0  (1.803 m)    Physical Exam  Constitutional: He is oriented to person, place, and time and well-developed, well-nourished, and in no distress.  HENT:  Head: Normocephalic.  Right Ear: Tympanic membrane, external ear and ear canal normal.  Left Ear: Tympanic membrane, external ear and ear canal normal.  Nose: Nose normal.  Mouth/Throat: Oropharynx is clear and moist. No oral lesions. No posterior oropharyngeal edema or posterior oropharyngeal erythema.  Eyes: Conjunctivae and EOM are normal. Pupils are equal, round, and reactive to light. Right eye exhibits no discharge. Left eye exhibits no discharge. No scleral  icterus.  Neck: Normal range of motion. Neck supple. No JVD present. No tracheal deviation present. No thyromegaly present.  Cardiovascular: Normal rate, regular rhythm, normal heart sounds and intact distal pulses.  Exam reveals no gallop and no friction rub.   No murmur heard. Pulmonary/Chest: Breath sounds normal. No respiratory distress. He has no wheezes. He has no rales.  Abdominal: Soft. Bowel sounds are normal. He exhibits no mass. There is no hepatosplenomegaly. There is no tenderness. There is no rebound, no guarding and no CVA tenderness.  Musculoskeletal: Normal range of motion. He exhibits no edema or tenderness.  Lymphadenopathy:    He has no cervical adenopathy.  Neurological: He is alert and oriented to person, place, and time. He has normal sensation, normal strength, normal reflexes and intact cranial nerves. No cranial nerve deficit.  Skin: Skin is warm. No rash noted.  Psychiatric: Mood and affect normal.  Nursing note and vitals reviewed.     Assessment & Plan  Problem List Items Addressed This Visit    None    Visit Diagnoses    Pharyngitis    -  Primary   Relevant Medications   amoxicillin-clavulanate (AUGMENTIN) 875-125 MG tablet   Malaise and fatigue       Relevant Orders   COMPLETE METABOLIC PANEL WITH GFR   CBC   Sed Rate (ESR)   Thyroid Panel With TSH        Dr. Quintavia Rogstad [facility]  12/02/15

## 2015-12-03 LAB — CBC
HEMATOCRIT: 43.4 % (ref 37.5–51.0)
HEMOGLOBIN: 14.3 g/dL (ref 12.6–17.7)
MCH: 30.2 pg (ref 26.6–33.0)
MCHC: 32.9 g/dL (ref 31.5–35.7)
MCV: 92 fL (ref 79–97)
Platelets: 273 10*3/uL (ref 150–379)
RBC: 4.74 x10E6/uL (ref 4.14–5.80)
RDW: 15 % (ref 12.3–15.4)
WBC: 5 10*3/uL (ref 3.4–10.8)

## 2015-12-03 LAB — CMP14+EGFR
ALT: 48 IU/L — AB (ref 0–44)
AST: 34 IU/L (ref 0–40)
Albumin/Globulin Ratio: 1.5 (ref 1.2–2.2)
Albumin: 4 g/dL (ref 3.5–5.5)
Alkaline Phosphatase: 116 IU/L (ref 39–117)
BILIRUBIN TOTAL: 0.7 mg/dL (ref 0.0–1.2)
BUN/Creatinine Ratio: 11 (ref 9–20)
BUN: 10 mg/dL (ref 6–24)
CALCIUM: 9.1 mg/dL (ref 8.7–10.2)
CHLORIDE: 101 mmol/L (ref 96–106)
CO2: 27 mmol/L (ref 18–29)
Creatinine, Ser: 0.92 mg/dL (ref 0.76–1.27)
GFR calc non Af Amer: 94 mL/min/{1.73_m2} (ref >59)
GFR, EST AFRICAN AMERICAN: 109 mL/min/{1.73_m2} (ref >59)
GLUCOSE: 93 mg/dL (ref 65–99)
Globulin, Total: 2.6 g/dL (ref 1.5–4.5)
Potassium: 4 mmol/L (ref 3.5–5.2)
Sodium: 143 mmol/L (ref 134–144)
TOTAL PROTEIN: 6.6 g/dL (ref 6.0–8.5)

## 2015-12-03 LAB — THYROID PANEL WITH TSH
FREE THYROXINE INDEX: 1.8 (ref 1.2–4.9)
T3 Uptake Ratio: 24 % (ref 24–39)
T4, Total: 7.4 ug/dL (ref 4.5–12.0)
TSH: 1.36 u[IU]/mL (ref 0.450–4.500)

## 2015-12-03 LAB — SEDIMENTATION RATE: Sed Rate: 2 mm/hr (ref 0–30)

## 2015-12-10 ENCOUNTER — Ambulatory Visit (INDEPENDENT_AMBULATORY_CARE_PROVIDER_SITE_OTHER): Payer: BLUE CROSS/BLUE SHIELD | Admitting: Internal Medicine

## 2015-12-10 ENCOUNTER — Encounter: Payer: Self-pay | Admitting: Internal Medicine

## 2015-12-10 DIAGNOSIS — K121 Other forms of stomatitis: Secondary | ICD-10-CM | POA: Diagnosis not present

## 2015-12-10 DIAGNOSIS — D170 Benign lipomatous neoplasm of skin and subcutaneous tissue of head, face and neck: Secondary | ICD-10-CM | POA: Diagnosis not present

## 2015-12-10 DIAGNOSIS — B009 Herpesviral infection, unspecified: Secondary | ICD-10-CM | POA: Diagnosis not present

## 2015-12-10 MED ORDER — MAGIC MOUTHWASH W/LIDOCAINE: 5.0000 mL | Freq: Three times a day (TID) | ORAL | 0 refills | Status: DC

## 2015-12-10 MED ORDER — VALACYCLOVIR HCL 1 G PO TABS: 1000.0000 mg | ORAL_TABLET | Freq: Two times a day (BID) | ORAL | 0 refills | Status: DC

## 2015-12-10 NOTE — Progress Notes (Signed)
Date:  12/10/2015   Name:  Derek Steele   DOB:  1960-11-02   MRN:  [id number]   Chief Complaint: Sore Throat (Frustrated can not get rid of this throat pain has fatigue and hot cold flashes and nothing is helping. ) Patient reports 3 episodes of some different ulcers within his mouth. These episodes have occurred maybe over the past several years. In June he was seen and had a positive HSV-1 test. He was treated with a course of valacyclovir and seemed to improve. In July he noticed a lump on the right side of his neck. He was seen by Dr. Kathyrn Sheriff who ordered a CT of the neck. This demonstrated a benign lipoma. At that time the patient did not have any oral lesions. However he does report that Dr. Farrel Conners did a brief laryngoscope and saw no abnormalities of the vocal cords no masses no ulcerations and no evidence of reflux. He was seen last week with recurrence of sore throat. He was prescribed amoxicillin. Blood work showed no abnormality. He is here today because he still has a very sore throat with pain in the lower right throat. He denies associated symptoms such as productive cough postnasal drip sinus drainage or ear pain. He has felt fatigued and diaphoretic without overt fever.   Review of Systems  Constitutional: Positive for fatigue. Negative for chills and fever.  HENT: Positive for sore throat and trouble swallowing. Negative for ear pain, hearing loss, sinus pressure and voice change.   Respiratory: Negative for chest tightness, shortness of breath and wheezing.   Cardiovascular: Negative for chest pain and palpitations.  Neurological: Positive for light-headedness. Negative for dizziness and headaches.    Patient Active Problem List   Diagnosis Date Noted  . Special screening for malignant neoplasms, colon   . Benign neoplasm of sigmoid colon   . Benign neoplasm of descending colon   . Benign neoplasm of ascending colon   . Annual physical exam 08/29/2015  . Essential  hypertension 08/29/2015  . Accelerated hypertension 07/04/2014  . Combined fat and carbohydrate induced hyperlipemia 07/04/2014    Prior to Admission medications   Medication Sig Start Date End Date Taking? Authorizing Provider  amLODipine (NORVASC) 5 MG tablet Take 1 tablet (5 mg total) by mouth daily. 08/29/15  Yes Juline Patch, MD  amoxicillin-clavulanate (AUGMENTIN) 875-125 MG tablet Take 1 tablet by mouth 2 (two) times daily. 12/02/15  Yes Juline Patch, MD  losartan (COZAAR) 100 MG tablet Take 100 mg by mouth daily. Dr Nehemiah Massed 05/11/15  Yes Historical Provider, MD  valACYclovir (VALTREX) 1000 MG tablet Take 1 tablet (1,000 mg total) by mouth 2 (two) times daily. 09/22/15  Yes Juline Patch, MD    No Known Allergies  Past Surgical History:  Procedure Laterality Date  . COLONOSCOPY WITH PROPOFOL N/A 10/31/2015   Procedure: COLONOSCOPY WITH PROPOFOL;  Surgeon: Lucilla Lame, MD;  Location: [facility];  Service: Endoscopy;  Laterality: N/A;  . POLYPECTOMY N/A 10/31/2015   Procedure: POLYPECTOMY;  Surgeon: Lucilla Lame, MD;  Location: [facility];  Service: Endoscopy;  Laterality: N/A;  sigmoid colon polyp x 2 cecal polyp acsending colon polyp x 2 transverse colon polyp   . REPAIR OF PERFORATED ULCER  age: mid 30's    Social History  Substance Use Topics  . Smoking status: Former Research scientist (life sciences)  . Smokeless tobacco: Former Systems developer    Quit date: 04/06/1995  . Alcohol use No     Comment:  rare- holidays     Medication list has been reviewed and updated.   Physical Exam  Constitutional: He is oriented to person, place, and time. He appears well-developed. No distress.  HENT:  Head: Normocephalic and atraumatic.  Right Ear: Tympanic membrane and ear canal normal.  Left Ear: Tympanic membrane and ear canal normal.  Nose: Right sinus exhibits no maxillary sinus tenderness and no frontal sinus tenderness. Left sinus exhibits no maxillary sinus tenderness and no frontal sinus  tenderness.  Mouth/Throat: Uvula is midline, oropharynx is clear and moist and mucous membranes are normal.  Neck:    Cardiovascular: Normal rate, regular rhythm and normal heart sounds.   Pulmonary/Chest: Effort normal and breath sounds normal. No respiratory distress.  Musculoskeletal: Normal range of motion.  Neurological: He is alert and oriented to person, place, and time.  Skin: Skin is warm and dry. No rash noted. He is not diaphoretic.  Psychiatric: His behavior is normal. Thought content normal.  Irritable mood  Nursing note and vitals reviewed.   BP 124/86   Pulse (!) 102   Resp 16   Ht 5\' 11"  (1.803 m)   Wt 210 lb (95.3 kg)   SpO2 98%   BMI 29.29 kg/m   Assessment and Plan: 1. 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  Dispense: 20 tablet; Refill: 0  3. 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

## 2016-02-24 ENCOUNTER — Other Ambulatory Visit: Payer: Self-pay

## 2016-02-24 MED ORDER — LOSARTAN POTASSIUM 100 MG PO TABS: 100.0000 mg | ORAL_TABLET | Freq: Every day | ORAL | 0 refills | Status: DC

## 2016-03-01 ENCOUNTER — Ambulatory Visit: Payer: BLUE CROSS/BLUE SHIELD | Admitting: Family Medicine

## 2016-03-04 DIAGNOSIS — I10 Essential (primary) hypertension: Secondary | ICD-10-CM | POA: Diagnosis not present

## 2016-03-04 DIAGNOSIS — E782 Mixed hyperlipidemia: Secondary | ICD-10-CM | POA: Diagnosis not present

## 2016-03-11 ENCOUNTER — Encounter: Payer: Self-pay | Admitting: Family Medicine

## 2016-03-11 ENCOUNTER — Other Ambulatory Visit: Payer: Self-pay | Admitting: *Deleted

## 2016-03-11 ENCOUNTER — Ambulatory Visit (INDEPENDENT_AMBULATORY_CARE_PROVIDER_SITE_OTHER): Payer: BLUE CROSS/BLUE SHIELD | Admitting: Family Medicine

## 2016-03-11 VITALS — BP 164/110 | HR 86 | Temp 97.6°F | Ht 71.0 in | Wt 213.0 lb

## 2016-03-11 DIAGNOSIS — I10 Essential (primary) hypertension: Secondary | ICD-10-CM | POA: Diagnosis not present

## 2016-03-11 DIAGNOSIS — Z23 Encounter for immunization: Secondary | ICD-10-CM

## 2016-03-11 DIAGNOSIS — M25562 Pain in left knee: Secondary | ICD-10-CM

## 2016-03-11 DIAGNOSIS — E782 Mixed hyperlipidemia: Secondary | ICD-10-CM | POA: Diagnosis not present

## 2016-03-11 MED ORDER — AMLODIPINE BESYLATE 5 MG PO TABS: 5.0000 mg | ORAL_TABLET | Freq: Every day | ORAL | 6 refills | Status: DC

## 2016-03-11 MED ORDER — LOSARTAN POTASSIUM 100 MG PO TABS: 100.0000 mg | ORAL_TABLET | Freq: Every day | ORAL | 6 refills | Status: AC

## 2016-03-11 NOTE — Progress Notes (Signed)
Name: Derek Steele   MRN: [id number]    DOB: Sep 22, 1960   Date:03/11/2016       Progress Note  Subjective  Chief Complaint  Chief Complaint  Patient presents with  . Sore Throat    pt stated doing much better....6 month F/U    Hypertension  This is a chronic problem. The current episode started more than 1 year ago. The problem has been waxing and waning since onset. The problem is uncontrolled. Pertinent negatives include no anxiety, blurred vision, chest pain, headaches, malaise/fatigue, neck pain, orthopnea, palpitations, peripheral edema, PND, shortness of breath or sweats. There are no associated agents to hypertension. Risk factors for coronary artery disease include dyslipidemia and male gender. Past treatments include angiotensin blockers. The current treatment provides mild improvement. There are no compliance problems.  There is no history of angina, kidney disease, CAD/MI, CVA, heart failure, left ventricular hypertrophy, PVD, renovascular disease or retinopathy. There is no history of chronic renal disease or a hypertension causing med.  Hyperlipidemia  This is a chronic problem. The problem is controlled. Recent lipid tests were reviewed and are normal. He has no history of chronic renal disease. Pertinent negatives include no chest pain, focal weakness, myalgias or shortness of breath. He is currently on no antihyperlipidemic treatment. There are no compliance problems.  There are no known risk factors for coronary artery disease.  Knee Pain   The incident occurred more than 1 week ago. There was no injury mechanism. The pain is present in the left knee. The quality of the pain is described as aching. The pain is at a severity of 4/10. The pain is moderate. The pain has been fluctuating since onset. Pertinent negatives include no inability to bear weight, loss of motion, loss of sensation, muscle weakness, numbness or tingling. The symptoms are aggravated by movement. He has tried  nothing (desires to see orthopedist) for the symptoms. The treatment provided no relief.    No problem-specific Assessment & Plan notes found for this encounter.   Past Medical History:  Diagnosis Date  . Hypertension     Past Surgical History:  Procedure Laterality Date  . COLONOSCOPY WITH PROPOFOL N/A 10/31/2015   Procedure: COLONOSCOPY WITH PROPOFOL;  Surgeon: Lucilla Lame, MD;  Location: [place];  Service: Endoscopy;  Laterality: N/A;  . POLYPECTOMY N/A 10/31/2015   Procedure: POLYPECTOMY;  Surgeon: Lucilla Lame, MD;  Location: [place];  Service: Endoscopy;  Laterality: N/A;  sigmoid colon polyp x 2 cecal polyp acsending colon polyp x 2 transverse colon polyp   . REPAIR OF PERFORATED ULCER  age: mid 17's    No family history on file.  Social History   Social History  . Marital status: Married    Spouse name: N/A  . Number of children: N/A  . Years of education: N/A   Occupational History  . Not on file.   Social History Main Topics  . Smoking status: Former Research scientist (life sciences)  . Smokeless tobacco: Former Systems developer    Quit date: 04/06/1995  . Alcohol use No     Comment: rare- holidays  . Drug use: No  . Sexual activity: Yes   Other Topics Concern  . Not on file   Social History Narrative  . No narrative on file    No Known Allergies   Review of Systems  Constitutional: Negative for chills, fever, malaise/fatigue and weight loss.  HENT: Negative for ear discharge, ear pain and sore throat.   Eyes: Negative for  blurred vision.  Respiratory: Negative for cough, sputum production, shortness of breath and wheezing.   Cardiovascular: Negative for chest pain, palpitations, orthopnea, leg swelling and PND.  Gastrointestinal: Negative for abdominal pain, blood in stool, constipation, diarrhea, heartburn, melena and nausea.  Genitourinary: Negative for dysuria, frequency, hematuria and urgency.  Musculoskeletal: Negative for back pain, joint pain, myalgias  and neck pain.  Skin: Negative for rash.  Neurological: Negative for dizziness, tingling, sensory change, focal weakness, numbness and headaches.  Endo/Heme/Allergies: Negative for environmental allergies and polydipsia. Does not bruise/bleed easily.  Psychiatric/Behavioral: Negative for depression and suicidal ideas. The patient is not nervous/anxious and does not have insomnia.      Objective  Vitals:   03/11/16 0819  BP: (!) 164/110  Pulse: 86  Temp: 97.6 F (36.4 C)  SpO2: 98%  Weight: 213 lb (96.6 kg)  Height: 5\' 11"  (1.803 m)    Physical Exam  Constitutional: He is oriented to person, place, and time and well-developed, well-nourished, and in no distress.  HENT:  Head: Normocephalic.  Right Ear: External ear normal.  Left Ear: External ear normal.  Nose: Nose normal.  Mouth/Throat: Oropharynx is clear and moist.  Eyes: Conjunctivae and EOM are normal. Pupils are equal, round, and reactive to light. Right eye exhibits no discharge. Left eye exhibits no discharge. No scleral icterus.  Neck: Normal range of motion. Neck supple. No JVD present. No tracheal deviation present. No thyromegaly present.  Cardiovascular: Normal rate, regular rhythm, normal heart sounds and intact distal pulses.  Exam reveals no gallop and no friction rub.   No murmur heard. Pulmonary/Chest: Breath sounds normal. No respiratory distress. He has no wheezes. He has no rales.  Abdominal: Soft. Bowel sounds are normal. He exhibits no mass. There is no hepatosplenomegaly. There is no tenderness. There is no rebound, no guarding and no CVA tenderness.  Musculoskeletal: Normal range of motion. He exhibits no edema or tenderness.       Left knee: No tenderness found. No medial joint line, no lateral joint line, no MCL, no LCL and no patellar tendon tenderness noted.  Lymphadenopathy:    He has no cervical adenopathy.  Neurological: He is alert and oriented to person, place, and time. He has normal  sensation, normal strength, normal reflexes and intact cranial nerves. No cranial nerve deficit.  Skin: Skin is warm. No rash noted.  Psychiatric: Mood and affect normal.  Nursing note and vitals reviewed.     Assessment & Plan  Problem List Items Addressed This Visit      Cardiovascular and Mediastinum   Essential hypertension - Primary   Relevant Medications   losartan (COZAAR) 100 MG tablet   amLODipine (NORVASC) 5 MG tablet   Other Relevant Orders   Renal Function Panel    Other Visit Diagnoses    Mixed hyperlipidemia       Relevant Medications   losartan (COZAAR) 100 MG tablet   amLODipine (NORVASC) 5 MG tablet   Other Relevant Orders   Lipid Profile   Acute pain of left knee       Relevant Orders   Ambulatory referral to Orthopedic Surgery   Encounter for immunization       Relevant Orders   Flu Vaccine QUAD 36+ mos IM (Completed)        Dr. Deanna Jones [facility]  03/11/16

## 2016-03-12 LAB — LIPID PANEL
CHOLESTEROL TOTAL: 214 mg/dL — AB (ref 100–199)
Chol/HDL Ratio: 4.5 ratio units (ref 0.0–5.0)
HDL: 48 mg/dL (ref >39)
LDL Calculated: 147 mg/dL — ABNORMAL HIGH (ref 0–99)
TRIGLYCERIDES: 95 mg/dL (ref 0–149)
VLDL CHOLESTEROL CAL: 19 mg/dL (ref 5–40)

## 2016-03-12 LAB — RENAL FUNCTION PANEL
ALBUMIN: 4.1 g/dL (ref 3.5–5.5)
BUN/Creatinine Ratio: 12 (ref 9–20)
BUN: 10 mg/dL (ref 6–24)
CALCIUM: 9 mg/dL (ref 8.7–10.2)
CHLORIDE: 102 mmol/L (ref 96–106)
CO2: 27 mmol/L (ref 18–29)
Creatinine, Ser: 0.86 mg/dL (ref 0.76–1.27)
GFR calc Af Amer: 114 mL/min/{1.73_m2} (ref >59)
GFR calc non Af Amer: 98 mL/min/{1.73_m2} (ref >59)
Glucose: 91 mg/dL (ref 65–99)
PHOSPHORUS: 3.3 mg/dL (ref 2.5–4.5)
Potassium: 4.5 mmol/L (ref 3.5–5.2)
Sodium: 143 mmol/L (ref 134–144)

## 2016-03-16 DIAGNOSIS — M25562 Pain in left knee: Secondary | ICD-10-CM | POA: Diagnosis not present

## 2016-03-16 DIAGNOSIS — M2392 Unspecified internal derangement of left knee: Secondary | ICD-10-CM | POA: Diagnosis not present

## 2016-03-16 DIAGNOSIS — M1712 Unilateral primary osteoarthritis, left knee: Secondary | ICD-10-CM | POA: Diagnosis not present

## 2016-04-15 ENCOUNTER — Ambulatory Visit (INDEPENDENT_AMBULATORY_CARE_PROVIDER_SITE_OTHER): Payer: BLUE CROSS/BLUE SHIELD | Admitting: Family Medicine

## 2016-04-15 ENCOUNTER — Encounter: Payer: Self-pay | Admitting: Family Medicine

## 2016-04-15 VITALS — BP 130/100 | HR 80 | Ht 71.0 in | Wt 209.0 lb

## 2016-04-15 DIAGNOSIS — F17220 Nicotine dependence, chewing tobacco, uncomplicated: Secondary | ICD-10-CM | POA: Diagnosis not present

## 2016-04-15 DIAGNOSIS — K121 Other forms of stomatitis: Secondary | ICD-10-CM | POA: Diagnosis not present

## 2016-04-15 DIAGNOSIS — Z1159 Encounter for screening for other viral diseases: Secondary | ICD-10-CM | POA: Diagnosis not present

## 2016-04-15 DIAGNOSIS — J029 Acute pharyngitis, unspecified: Secondary | ICD-10-CM

## 2016-04-15 DIAGNOSIS — Z114 Encounter for screening for human immunodeficiency virus [HIV]: Secondary | ICD-10-CM | POA: Diagnosis not present

## 2016-04-15 LAB — POCT RAPID STREP A (OFFICE): Rapid Strep A Screen: NEGATIVE

## 2016-04-15 MED ORDER — ACYCLOVIR 200 MG PO CAPS: 200.0000 mg | ORAL_CAPSULE | Freq: Every morning | ORAL | 6 refills | Status: AC

## 2016-04-15 NOTE — Progress Notes (Signed)
Name: Derek Steele   MRN: [id number]    DOB: 1960/12/16   Date:04/15/2016       Progress Note  Subjective  Chief Complaint  Chief Complaint  Patient presents with  . Mouth Lesions    back of throat on R) side- last episode was September    Mouth Lesions   The current episode started 5 to 7 days ago. The onset was gradual. Progression since onset: intermitant. The problem is moderate. Nothing aggravates the symptoms. Associated symptoms include headaches, mouth sores and rhinorrhea. Pertinent negatives include no fever, no decreased vision, no double vision, no eye itching, no photophobia, no abdominal pain, no constipation, no diarrhea, no nausea, no congestion, no ear discharge, no ear pain, no hearing loss, no sore throat, no stridor, no swollen glands, no neck pain, no cough, no wheezing, no rash, no eye discharge, no eye pain and no eye redness.    No problem-specific Assessment & Plan notes found for this encounter.   Past Medical History:  Diagnosis Date  . Hypertension     Past Surgical History:  Procedure Laterality Date  . COLONOSCOPY WITH PROPOFOL N/A 10/31/2015   Procedure: COLONOSCOPY WITH PROPOFOL;  Surgeon: Lucilla Lame, MD;  Location: [place];  Service: Endoscopy;  Laterality: N/A;  . POLYPECTOMY N/A 10/31/2015   Procedure: POLYPECTOMY;  Surgeon: Lucilla Lame, MD;  Location: [place];  Service: Endoscopy;  Laterality: N/A;  sigmoid colon polyp x 2 cecal polyp acsending colon polyp x 2 transverse colon polyp   . REPAIR OF PERFORATED ULCER  age: mid 37's    No family history on file.  Social History   Social History  . Marital status: Married    Spouse name: N/A  . Number of children: N/A  . Years of education: N/A   Occupational History  . Not on file.   Social History Main Topics  . Smoking status: Former Research scientist (life sciences)  . Smokeless tobacco: Former Systems developer    Quit date: 04/06/1995  . Alcohol use No     Comment: rare- holidays  . Drug  use: No  . Sexual activity: Yes   Other Topics Concern  . Not on file   Social History Narrative  . No narrative on file    No Known Allergies   Review of Systems  Constitutional: Negative for chills, fever, malaise/fatigue and weight loss.  HENT: Positive for mouth sores and rhinorrhea. Negative for congestion, ear discharge, ear pain, hearing loss and sore throat.   Eyes: Negative for blurred vision, double vision, photophobia, pain, discharge, redness and itching.  Respiratory: Negative for cough, sputum production, shortness of breath, wheezing and stridor.   Cardiovascular: Negative for chest pain, palpitations and leg swelling.  Gastrointestinal: Negative for abdominal pain, blood in stool, constipation, diarrhea, heartburn, melena and nausea.  Genitourinary: Negative for dysuria, frequency, hematuria and urgency.  Musculoskeletal: Negative for back pain, joint pain, myalgias and neck pain.  Skin: Negative for rash.  Neurological: Positive for headaches. Negative for dizziness, tingling, sensory change and focal weakness.  Endo/Heme/Allergies: Negative for environmental allergies and polydipsia. Does not bruise/bleed easily.  Psychiatric/Behavioral: Negative for depression and suicidal ideas. The patient is not nervous/anxious and does not have insomnia.      Objective  Vitals:   04/15/16 1016  BP: (!) 130/100  Pulse: 80  Weight: 209 lb (94.8 kg)  Height: 5\' 11"  (1.803 m)    Physical Exam  Constitutional: He is oriented to person, place, and time and well-developed,  well-nourished, and in no distress.  HENT:  Head: Normocephalic.  Right Ear: External ear normal.  Left Ear: External ear normal.  Nose: Nose normal.  Mouth/Throat: Oropharyngeal exudate, posterior oropharyngeal edema and posterior oropharyngeal erythema present.  Eyes: Conjunctivae and EOM are normal. Pupils are equal, round, and reactive to light. Right eye exhibits no discharge. Left eye exhibits no  discharge. No scleral icterus.  Neck: Normal range of motion. Neck supple. No JVD present. No tracheal deviation present. No thyromegaly present.  Cardiovascular: Normal rate, regular rhythm, normal heart sounds and intact distal pulses.  Exam reveals no gallop and no friction rub.   No murmur heard. Pulmonary/Chest: Breath sounds normal. No respiratory distress. He has no wheezes. He has no rales.  Abdominal: Soft. Bowel sounds are normal. He exhibits no mass. There is no hepatosplenomegaly. There is no tenderness. There is no rebound, no guarding and no CVA tenderness.  Musculoskeletal: Normal range of motion. He exhibits no edema or tenderness.  Lymphadenopathy:    He has no cervical adenopathy.  Neurological: He is alert and oriented to person, place, and time. He has normal sensation, normal strength, normal reflexes and intact cranial nerves. No cranial nerve deficit.  Skin: Skin is warm. No rash noted.  Psychiatric: Mood and affect normal.  Nursing note and vitals reviewed.     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 Brookelynn Hamor [facility]  04/15/16

## 2016-04-15 NOTE — Patient Instructions (Addendum)
Stomatitis  Stomatitis is a condition that causes inflammation in your mouth. It can affect a part of your mouth or your whole mouth. The condition often affects your cheek, teeth, gums, lips, and tongue. Stomatitis can also affect the mucous membranes that surround your mouth (mucosa).  Pain from stomatitis can make it hard for you to eat or drink. Severe cases of this condition can lead to dehydration or poor nutrition.  What are the causes?  Common causes of this condition include:  · Viruses, such as cold sores or oral herpes and shingles.  · Canker sores.  · Bacterial infections.  · Fungus or yeast infections, such as oral thrush.  · Not getting adequate nutrition.  · Injury to your mouth. This can be from:  ? Dentures or braces that do not fit well.  ? Biting your tongue or cheek.  ? Burning your mouth.  ? Having sharp or broken teeth.  · Gum disease.  · Using tobacco, especially chewing tobacco.  · Allergies to foods, medicines, or substances that are used in your mouth.  · Medicines, including cancer medicines (chemotherapy), antihistamines, and seizure medicines.    In some cases, the cause may not be known.  What increases the risk?  This condition is more likely to develop in people who:  · Have poor oral hygiene or poor nutrition.  · Have any condition that causes a dry mouth.  · Are under a lot of physical or emotional stress.  · Have any condition that weakens the body’s defense system (immune system).  · Are being treated for cancer.  · Smoke.    What are the signs or symptoms?  The most common symptoms of this condition are pain, swelling, and redness inside your mouth. The pain may feel like burning or stinging. It may get worse from eating or drinking. Other symptoms include:  · Painful, shallow sores (ulcers) in the mouth.  · Blisters in the mouth.  · Bleeding gums.  · Swollen gums.  · Irritability and fatigue.  · Bad breath.  · Bad taste in the mouth.  · Fever.    How is this diagnosed?  This  condition is diagnosed with a physical exam to check for bleeding gums and mouth ulcers. You may also have other tests, including:  · Blood tests to look for infection or vitamin deficiencies.  · Mouth swab to get a fluid sample to test for bacteria (culture).  · Tissue sample from an ulcer to examine under a microscope (biopsy).    How is this treated?  Treatment for stomatitis depends on the cause. Treatment may include medicines, such as:  · Over-the counter (OTC) pain medicines.  · Topical anesthetic to numb the area if you have severe pain.  · Antibiotics to treat a bacterial infection.  · Antifungals to treat a fungal infection.  · Antivirals to treat a viral infection.  · Mouth rinses that contain steroids to reduce the swelling in your mouth.  · Other medicines to coat or numb your mouth.    Follow these instructions at home:  Medicines   · Take medicines only as directed by your health care provider.  · If you were prescribed an antibiotic, finish all of it even if you start to feel better.  Lifestyle   · Practice good oral hygiene:  ? Gently brush your teeth with a soft, nylon-bristled toothbrush two times each day.  ? Floss your teeth every day.  ? Have your teeth   If you need help quitting, ask your health care provider.  Find ways to reduce stress. Try yoga or meditation. Ask your health care provider for other ideas. General instructions  Use a salt-water rinse for pain as directed by your health care provider. Mix 1 tsp of salt in 2 cups of water.  Drink enough fluid to keep your urine  clear or pale yellow. This will keep you hydrated. Contact a health care provider if:  Your symptoms get worse.  You develop new symptoms, especially:  A rash.  New symptoms that do not involve your mouth area.  Your symptoms last longer than three weeks.  Your stomatitis goes away and then returns.  You have a harder time eating and drinking normally.  You have increasing fatigue or weakness.  You lose your appetite or you feel nauseous.  You have a fever. This information is not intended to replace advice given to you by your health care provider. Make sure you discuss any questions you have with your health care provider. Document Released: 01/17/2007 Document Revised: 11/19/2015 Document Reviewed: 03/18/2014 Elsevier Interactive Patient Education  2017 [place] Introduction Canker sores are small, painful sores that develop inside your mouth. They may also be called aphthous ulcers. You can get canker sores on the inside of your lips or cheeks, on your tongue, or anywhere inside your mouth. You can have just one canker sore or several of them. Canker sores cannot be passed from one person to another (noncontagious). These sores are different than the sores that you may get on the outside of your lips (cold sores or fever blisters). Canker sores usually start as painful red bumps. Then they turn into small white, yellow, or gray ulcers that have red borders. The ulcers may be quite painful. The pain may be worse when you eat or drink. What are the causes? The cause of this condition is not known. What increases the risk? This condition is more likely to develop in:  Women.  People in their teens or 61s.  Women who are having their menstrual period.  People who are under a lot of emotional stress.  People who do not get enough iron or B vitamins.  People who have poor oral hygiene.  People who have an injury inside the mouth. This can happen after  having dental work or from chewing something hard. What are the signs or symptoms? Along with the canker sore, symptoms may also include:  Fever.  Fatigue.  Swollen lymph nodes in your neck. How is this diagnosed? This condition can be diagnosed based on your symptoms. Your health care provider will also examine your mouth. Your health care provider may also do tests if you get canker sores often or if they are very bad. Tests may include:  Blood tests to rule out other causes of canker sores.  Taking swabs from the sore to check for infection.  Taking a small piece of skin from the sore (biopsy) to test it for cancer. How is this treated? Most canker sores clear up without treatment in about 10 days. Home care is usually the only treatment that you will need. Over-the-counter medicines can relieve discomfort.If you have severe canker sores, your health care provider may prescribe:  Numbing ointment to relieve pain.  Vitamins.  Steroid medicines. These may be given as:  Oral pills.  Mouth rinses.  Gels.  Antibiotic mouth rinse. Follow these instructions at home:  Apply, take, or use medicines  only as directed by your health care provider. These include vitamins.  If you were prescribed an antibiotic mouth rinse, finish all of it even if you start to feel better.  Until the sores are healed:  Do not drink coffee or citrus juices.  Do not eat spicy or salty foods.  Use a mild, over-the-counter mouth rinse as directed by your health care provider.  Practice good oral hygiene.  Floss your teeth every day.  Brush your teeth with a soft brush twice each day. Contact a health care provider if:  Your symptoms do not get better after two weeks.  You also have a fever or swollen glands.  You get canker sores often.  You have a canker sore that is getting larger.  You cannot eat or drink due to your canker sores. This information is not intended to replace advice  given to you by your health care provider. Make sure you discuss any questions you have with your health care provider. Document Released: 07/17/2010 Document Revised: 08/28/2015 Document Reviewed: 02/20/2014  2017 Elsevier

## 2016-04-15 NOTE — Progress Notes (Signed)
Name: Derek Steele   MRN: [id number]    DOB: 1960/12/30   Date:04/15/2016       Progress Note  Subjective  Chief Complaint  Chief Complaint  Patient presents with  . Mouth Lesions    back of throat on R) side- last episode was September    Mouth Lesions   Associated symptoms include mouth sores.    No problem-specific Assessment & Plan notes found for this encounter.   Past Medical History:  Diagnosis Date  . Hypertension     Past Surgical History:  Procedure Laterality Date  . COLONOSCOPY WITH PROPOFOL N/A 10/31/2015   Procedure: COLONOSCOPY WITH PROPOFOL;  Surgeon: Lucilla Lame, MD;  Location: [place];  Service: Endoscopy;  Laterality: N/A;  . POLYPECTOMY N/A 10/31/2015   Procedure: POLYPECTOMY;  Surgeon: Lucilla Lame, MD;  Location: [place];  Service: Endoscopy;  Laterality: N/A;  sigmoid colon polyp x 2 cecal polyp acsending colon polyp x 2 transverse colon polyp   . REPAIR OF PERFORATED ULCER  age: mid 79's    No family history on file.  Social History   Social History  . Marital status: Married    Spouse name: N/A  . Number of children: N/A  . Years of education: N/A   Occupational History  . Not on file.   Social History Main Topics  . Smoking status: Former Research scientist (life sciences)  . Smokeless tobacco: Former Systems developer    Quit date: 04/06/1995  . Alcohol use No     Comment: rare- holidays  . Drug use: No  . Sexual activity: Yes   Other Topics Concern  . Not on file   Social History Narrative  . No narrative on file    No Known Allergies   Review of Systems  HENT: Positive for mouth sores.      Objective  Vitals:   04/15/16 1016  BP: (!) 130/100  Pulse: 80  Weight: 209 lb (94.8 kg)  Height: 5\' 11"  (1.803 m)    Physical Exam    Assessment & Plan  Problem List Items Addressed This Visit    None        Dr. Otilio Miu [facility]  04/15/16

## 2016-04-16 LAB — HEPATITIS C ANTIBODY: Hep C Virus Ab: <0.1 s/co ratio (ref 0.0–0.9)

## 2016-04-16 LAB — HIV ANTIBODY (ROUTINE TESTING W REFLEX): HIV Screen 4th Generation wRfx: NONREACTIVE

## 2016-06-01 ENCOUNTER — Encounter: Payer: Self-pay | Admitting: Family Medicine

## 2016-06-01 ENCOUNTER — Ambulatory Visit (INDEPENDENT_AMBULATORY_CARE_PROVIDER_SITE_OTHER): Payer: BLUE CROSS/BLUE SHIELD | Admitting: Family Medicine

## 2016-06-01 VITALS — BP 140/88 | HR 80 | Ht 71.0 in | Wt 206.0 lb

## 2016-06-01 DIAGNOSIS — F17220 Nicotine dependence, chewing tobacco, uncomplicated: Secondary | ICD-10-CM | POA: Diagnosis not present

## 2016-06-01 DIAGNOSIS — D170 Benign lipomatous neoplasm of skin and subcutaneous tissue of head, face and neck: Secondary | ICD-10-CM

## 2016-06-01 DIAGNOSIS — K121 Other forms of stomatitis: Secondary | ICD-10-CM

## 2016-06-01 DIAGNOSIS — K1321 Leukoplakia of oral mucosa, including tongue: Secondary | ICD-10-CM

## 2016-06-01 NOTE — Progress Notes (Signed)
Name: Derek Steele   MRN: [id number]    DOB: [birth year]   Date:06/01/2016       Progress Note  Subjective  Chief Complaint  Chief Complaint  Patient presents with  . Mouth Lesions    little sores in the back of throat (clusters)  "looks like it is spreading down throat further"- mouth is sore    Mouth Lesions   The current episode started more than 2 weeks ago (last seen 1/11 2018). The onset was gradual (thought it was going away). Progression since onset: wax and wanes. The problem is mild. The symptoms are relieved by one or more OTC medications. Associated symptoms include mouth sores, sore throat and swollen glands. Pertinent negatives include no fever, no decreased vision, no double vision, no eye itching, no photophobia, no abdominal pain, no constipation, no diarrhea, no nausea, no congestion, no ear discharge, no ear pain, no headaches, no hearing loss, no rhinorrhea, no stridor, no neck pain, no cough, no wheezing, no rash, no eye discharge, no eye pain and no eye redness. He has been eating and drinking normally. Recently, medical care has been given by a specialist (ent 6/17). Services received include medications given.    No problem-specific Assessment & Plan notes found for this encounter.   Past Medical History:  Diagnosis Date  . Hypertension     Past Surgical History:  Procedure Laterality Date  . COLONOSCOPY WITH PROPOFOL N/A 10/31/2015   Procedure: COLONOSCOPY WITH PROPOFOL;  Surgeon: Lucilla Lame, MD;  Location: [facility];  Service: Endoscopy;  Laterality: N/A;  . POLYPECTOMY N/A 10/31/2015   Procedure: POLYPECTOMY;  Surgeon: Lucilla Lame, MD;  Location: [facility];  Service: Endoscopy;  Laterality: N/A;  sigmoid colon polyp x 2 cecal polyp acsending colon polyp x 2 transverse colon polyp   . REPAIR OF PERFORATED ULCER  age: mid 11's    No family history on file.  Social History   Social History  . Marital status: Married    Spouse  name: N/A  . Number of children: N/A  . Years of education: N/A   Occupational History  . Not on file.   Social History Main Topics  . Smoking status: Former Research scientist (life sciences)  . Smokeless tobacco: Former Systems developer    Quit date: 04/06/1995  . Alcohol use No     Comment: rare- holidays  . Drug use: No  . Sexual activity: Yes   Other Topics Concern  . Not on file   Social History Narrative  . No narrative on file    No Known Allergies  Outpatient Medications Prior to Visit  Medication Sig Dispense Refill  . amLODipine (NORVASC) 5 MG tablet Take 1 tablet (5 mg total) by mouth daily. 30 tablet 6  . losartan (COZAAR) 100 MG tablet Take 1 tablet (100 mg total) by mouth daily. Dr Nehemiah Massed- will fill for 30 days ONLY then follow up with Dr Nehemiah Massed 30 tablet 6  . pravastatin (PRAVACHOL) 40 MG tablet   3   No facility-administered medications prior to visit.     Review of Systems  Constitutional: Negative for chills, fever, malaise/fatigue and weight loss.  HENT: Positive for mouth sores and sore throat. Negative for congestion, ear discharge, ear pain, hearing loss and rhinorrhea.   Eyes: Negative for blurred vision, double vision, photophobia, pain, discharge, redness and itching.  Respiratory: Negative for cough, sputum production, shortness of breath, wheezing and stridor.   Cardiovascular: Negative for chest pain, palpitations and leg swelling.  Gastrointestinal: Negative for abdominal pain, blood in stool, constipation, diarrhea, heartburn, melena and nausea.  Genitourinary: Negative for dysuria, frequency, hematuria and urgency.  Musculoskeletal: Negative for back pain, joint pain, myalgias and neck pain.  Skin: Negative for rash.  Neurological: Negative for dizziness, tingling, sensory change, focal weakness and headaches.  Endo/Heme/Allergies: Negative for environmental allergies and polydipsia. Does not bruise/bleed easily.  Psychiatric/Behavioral: Negative for depression and suicidal  ideas. The patient is not nervous/anxious and does not have insomnia.      Objective  Vitals:   06/01/16 1613  BP: 140/88  Pulse: 80  Weight: 206 lb (93.4 kg)  Height: 5\' 11"  (1.803 m)    Physical Exam  Constitutional: He is oriented to person, place, and time and well-developed, well-nourished, and in no distress.  HENT:  Head: Normocephalic.  Right Ear: External ear normal.  Left Ear: External ear normal.  Nose: Nose normal.  Mouth/Throat: Uvula is midline and oropharynx is clear and moist. Oral lesions present. Abnormal dentition. No oropharyngeal exudate, posterior oropharyngeal edema or posterior oropharyngeal erythema.    Eyes: Conjunctivae and EOM are normal. Pupils are equal, round, and reactive to light. Right eye exhibits no discharge. Left eye exhibits no discharge. No scleral icterus.  Neck: Normal range of motion. Neck supple. No JVD present. No tracheal deviation present. No thyromegaly present.  Cardiovascular: Normal rate, regular rhythm, normal heart sounds and intact distal pulses.  Exam reveals no gallop and no friction rub.   No murmur heard. Pulmonary/Chest: Breath sounds normal. No respiratory distress. He has no wheezes. He has no rales.  Abdominal: Soft. Bowel sounds are normal. He exhibits no mass. There is no hepatosplenomegaly. There is no tenderness. There is no rebound, no guarding and no CVA tenderness.  Musculoskeletal: Normal range of motion. He exhibits no edema or tenderness.  Lymphadenopathy:    He has no cervical adenopathy.  Neurological: He is alert and oriented to person, place, and time. He has normal sensation, normal strength and intact cranial nerves. No cranial nerve deficit.  Skin: Skin is warm. No rash noted.  Psychiatric: Mood and affect normal.  Nursing note and vitals reviewed.     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     Dr. [facility]  06/01/16

## 2016-06-01 NOTE — Patient Instructions (Signed)
Leukoplakia Leukoplakia refers to white patches that develop in your mouth. These patches may show up on the insides of your cheeks, on or under your tongue, or on your gums. Leukoplakia can also develop on the outer area of the male genitalia (vulva ). In rare cases, it can occur in the area around the anus. Leukoplakia usually goes away with treatment. In some cases, leukoplakia can indicate an increased risk of cancer. What are the causes? Many conditions can cause or increase the risk of leukoplakia in the mouth. These may include:  Any type of tobacco use, especially when combined with the use of alcohol.  Irritation of the mouth from rough teeth or dentures.  Having a weakened defense system (immune system), such as occurs with HIV or AIDS or after a bone marrow transplant. Many conditions can also cause or increase the risk of leukoplakia of the vulva, including:  Long-lasting infections of the vulva.  Some skin diseases.  Long-lasting irritation of the vulva.  Long-term use of steroid creams or ointments.  Having a weakened immune system, such as occurs with HIV or AIDS or after a bone marrow transplant. What are the signs or symptoms? The main symptom is the development of patches or flat areas in the mouth or on the vulva. These patches may be:  Odd shaped.  Hard.  Raised.  White or gray in color. Some areas may be reddened.  Unable to be wiped away or scraped away.  Fuzzy.  Sensitive to touch, heat, or foods that are spicy or acidic. How is this diagnosed? Leukoplakia has a distinct appearance, so your health care provider can usually make a diagnosis by closely examining the affected area. A sample of the white patch may be removed (biopsy) and examined under a microscope. This helps to confirm the diagnosis and make sure that it is not a form of cancer. How is this treated? Treatment for leukoplakia may include:  Stopping tobacco use.  Repairing any rough  teeth or dentures.  Surgical removal of the patch. This may be done using a surgical knife (scalpel), laser, heat, or cold.  Medicines that can be taken by mouth.  Medicines that can be applied to the patches. Follow these instructions at home:  Do not use any tobacco products, including cigarettes, chewing tobacco, or electronic cigarettes. If you need help quitting, ask your health care provider.  Check with your dentist to see if you need any repairs to teeth or dentures.  Take medicines only as directed by your health care provider.  Avoid foods or beverages that seem to irritate the leukoplakia. Contact a health care provider if:  You develop new patches of leukoplakia.  You notice changes in the size, shape, or feeling of existing patches.  You have a fever. Get help right away if:  You have severe pain in the area of a patch, and the pain is not helped by prescribed medicine.  You have bleeding in the area of a patch, and you cannot stop the bleeding.  You have a patch in your mouth that becomes so swollen that you have trouble eating or breathing.  You have a patch at the vulva that becomes so swollen that you have trouble passing urine. This information is not intended to replace advice given to you by your health care provider. Make sure you discuss any questions you have with your health care provider. Document Released: 03/04/2008 Document Revised: 08/28/2015 Document Reviewed: 10/30/2013 Elsevier Interactive Patient Education  2017  [employer].

## 2016-09-09 ENCOUNTER — Ambulatory Visit: Payer: BLUE CROSS/BLUE SHIELD | Admitting: Family Medicine

## 2016-11-04 ENCOUNTER — Other Ambulatory Visit: Payer: Self-pay

## 2016-11-04 DIAGNOSIS — K121 Other forms of stomatitis: Secondary | ICD-10-CM

## 2016-11-26 DIAGNOSIS — K121 Other forms of stomatitis: Secondary | ICD-10-CM | POA: Diagnosis not present

## 2016-11-26 DIAGNOSIS — Z6828 Body mass index (BMI) 28.0-28.9, adult: Secondary | ICD-10-CM | POA: Diagnosis not present

## 2017-01-07 DIAGNOSIS — B002 Herpesviral gingivostomatitis and pharyngotonsillitis: Secondary | ICD-10-CM | POA: Diagnosis not present

## 2017-01-07 DIAGNOSIS — Z6828 Body mass index (BMI) 28.0-28.9, adult: Secondary | ICD-10-CM | POA: Diagnosis not present

## 2017-02-16 DIAGNOSIS — Z23 Encounter for immunization: Secondary | ICD-10-CM | POA: Diagnosis not present

## 2017-03-21 ENCOUNTER — Other Ambulatory Visit: Payer: Self-pay | Admitting: Family Medicine

## 2017-03-21 DIAGNOSIS — I10 Essential (primary) hypertension: Secondary | ICD-10-CM

## 2017-09-21 DIAGNOSIS — R1312 Dysphagia, oropharyngeal phase: Secondary | ICD-10-CM | POA: Diagnosis not present

## 2017-09-21 DIAGNOSIS — B001 Herpesviral vesicular dermatitis: Secondary | ICD-10-CM | POA: Diagnosis not present

## 2017-12-23 IMAGING — CT CT NECK W/O CM
4 series · 16 of 33 positions shown, 19 images · non-contrast
Comparison: None.

CLINICAL DATA: Right neck mass

EXAM:
CT NECK WITHOUT CONTRAST
TECHNIQUE: Multidetector CT imaging of the neck was performed following the
standard protocol without intravenous contrast.

[Series 2: axial neck · axial · 0.48mm/px · z∈[-188,-28]mm · 5 of 122 slices shown, 7 images]
[im 21/122  soft-tissue]
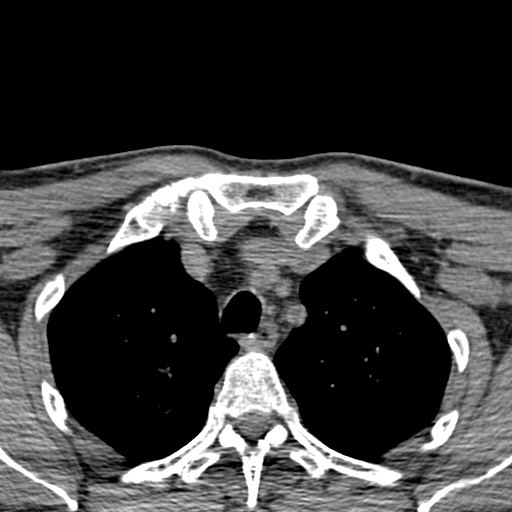
[im 21/122  bone]
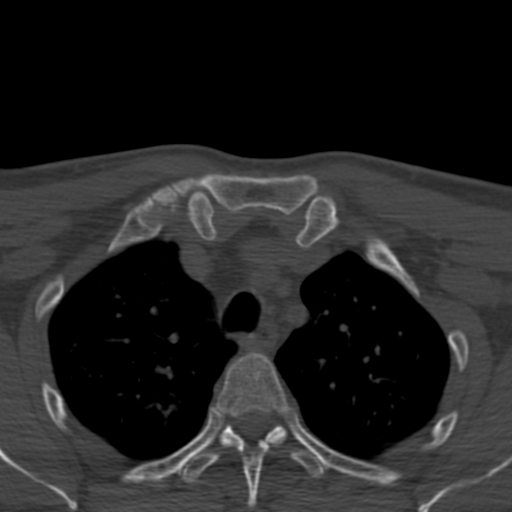
[im 41/122  bone]
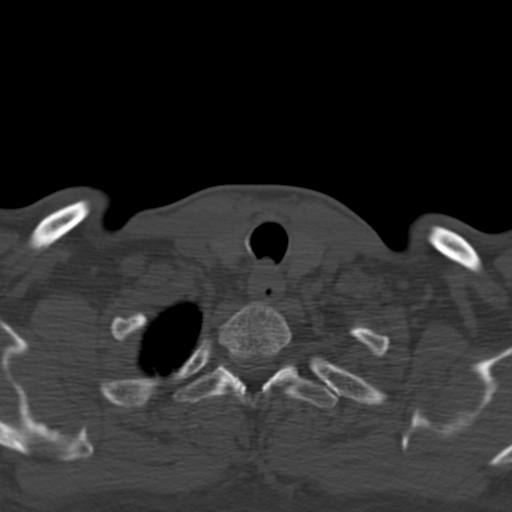
[im 61/122  bone]
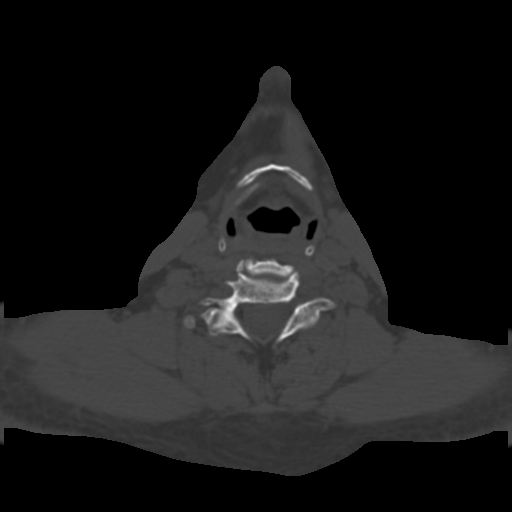
[im 81/122  bone]
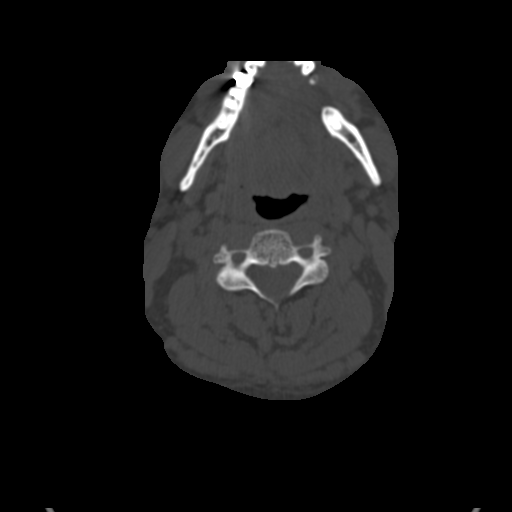
[im 101/122  soft-tissue]
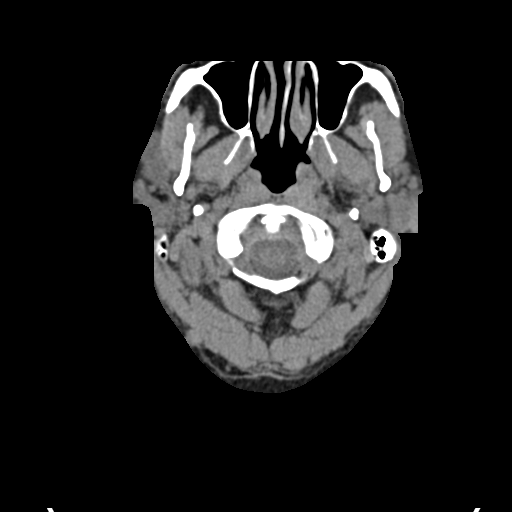
[im 101/122  bone]
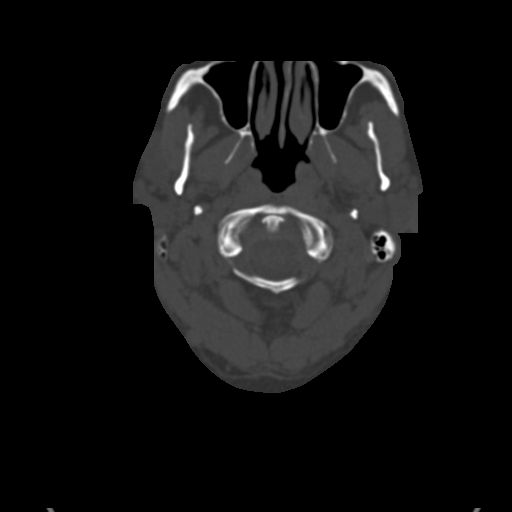

[Series 602: coronal · coronal · 0.48mm/px · 3 of 99 slices shown]
[im 30/99  bone]
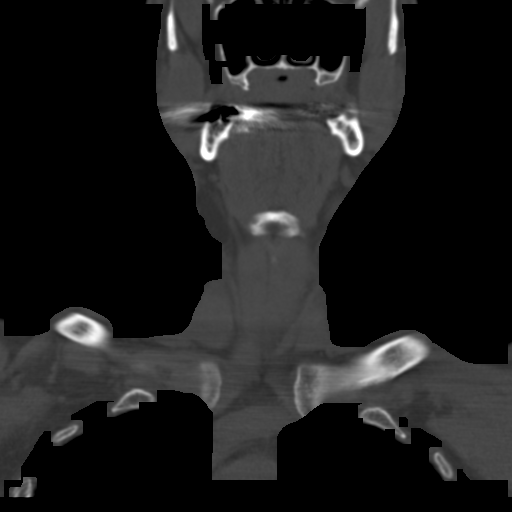
[im 43/99  bone]
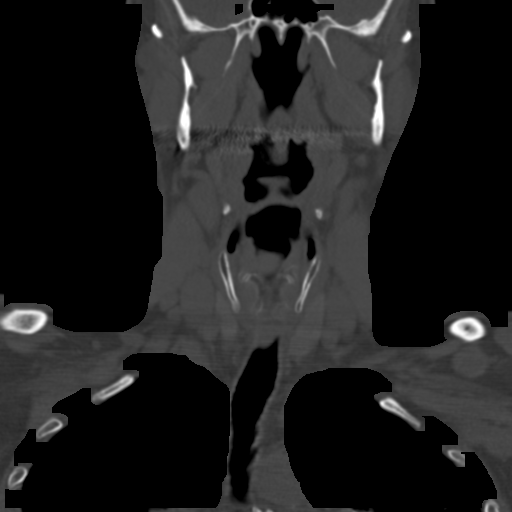
[im 56/99  bone]
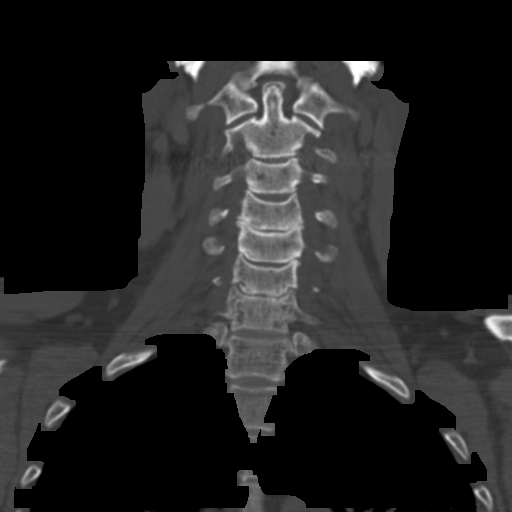

[Series 603: sagittal · sagittal · 0.48mm/px · 5 of 111 slices shown, 6 images]
[im 37/111  bone]
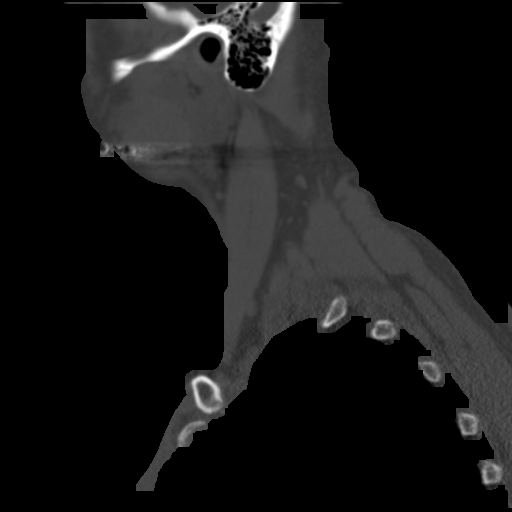
[im 46/111  bone]
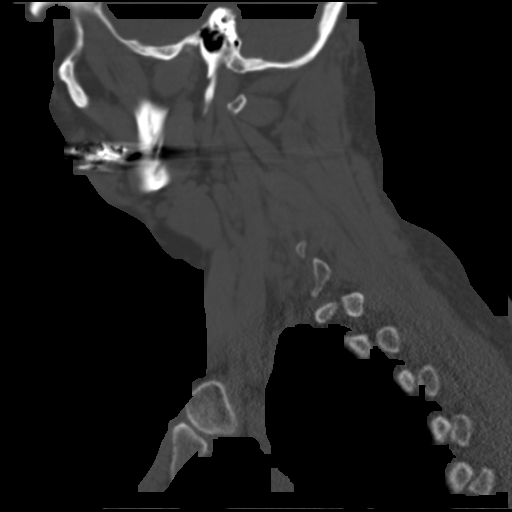
[im 56/111  soft-tissue]
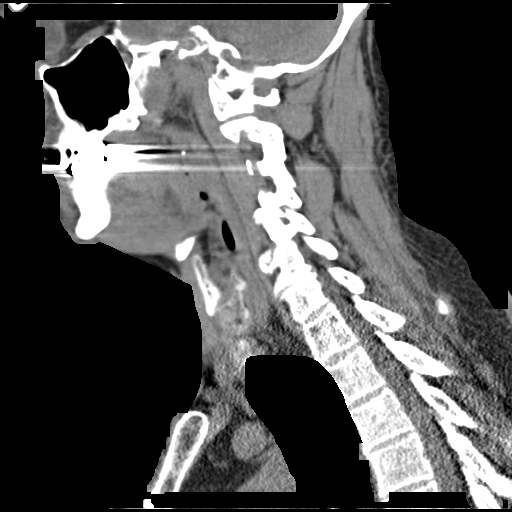
[im 56/111  bone]
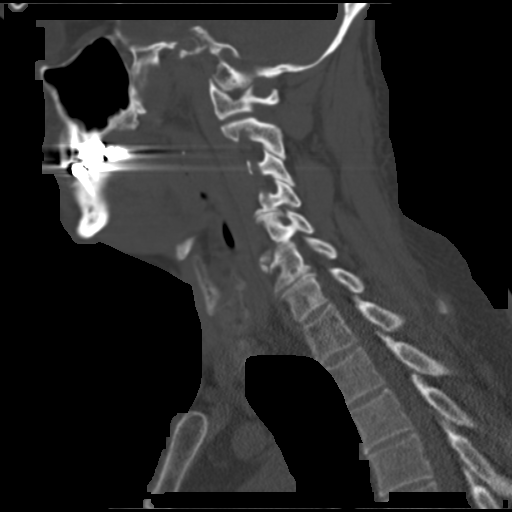
[im 65/111  bone]
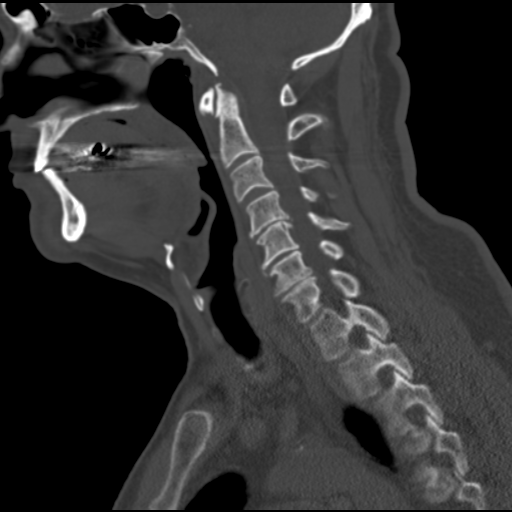
[im 74/111  bone]
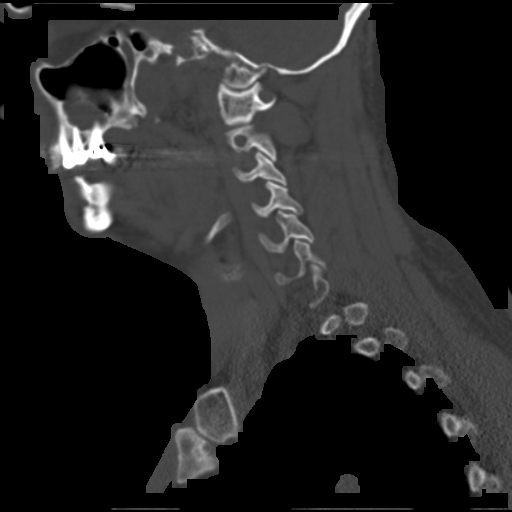

[Series 604: (person_name)/(person_name) · axial · 0.48mm/px · z∈[-124,-56]mm · 3 of 122 slices shown]
[im 21/122  bone]
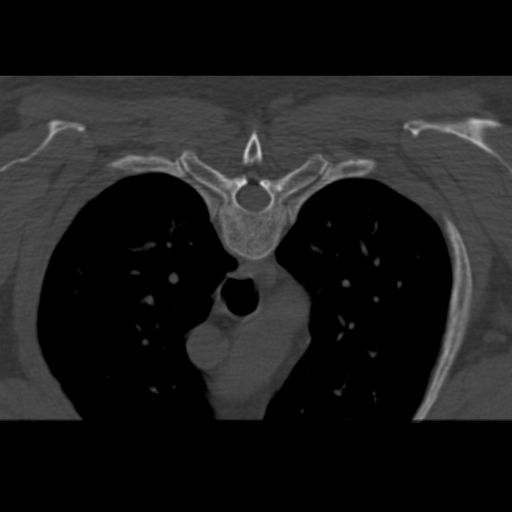
[im 41/122  bone]
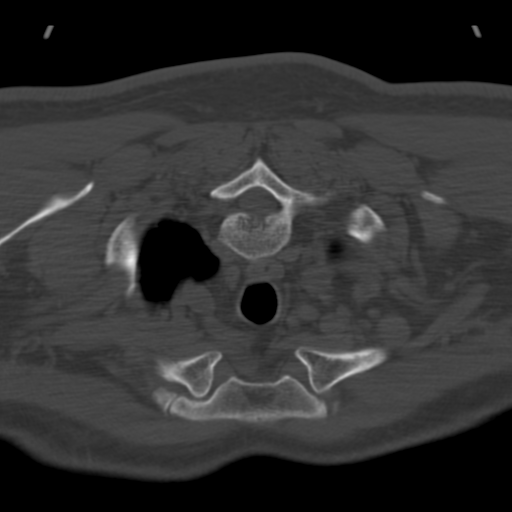
[im 61/122  bone]
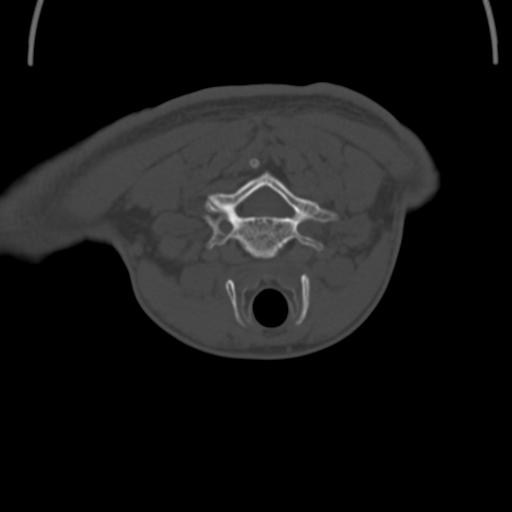

[16 of 33 positions shown; findings below may reference images not displayed]

FINDINGS: Pharynx and larynx: Normal pharynx. Tongue and tonsils normal.
Epiglottis and larynx normal.

Salivary glands: The parotid and submandibular glands are normal
bilaterally. There is flattening of the anterior portion of the
right submandibular gland due to a lipoma. The lipoma has
homogeneous fat density and measures approximately 16 x 61 mm on
axial images. The lipoma is deep to the platysmas muscle anterior to
the submandibular gland and below the mylohyoid muscle.

Thyroid: Normal

Lymph nodes: No pathologically enlarged lymph nodes in the neck.

Vascular: Carotid artery and jugular vein are identified. Patency
not evaluated due to lack of intravenous contrast

Limited intracranial: Negative

Visualized orbits: Negative

Mastoids and visualized paranasal sinuses: Mild mucosal edema in the
paranasal sinuses. Mastoid sinus clear bilaterally.

Skeleton: Disc degeneration and spondylosis at C4-5 and C6-7. No
fracture or bone lesion.

Upper chest: Lung apices clear.
IMPRESSION: Palpable abnormality in the right neck corresponds to a lipoma
measuring 16 x 61 mm. This appears to be a benign lipoma. Otherwise
no mass lesion or acute abnormality in the neck.

## 2019-01-09 DIAGNOSIS — R0602 Shortness of breath: Secondary | ICD-10-CM | POA: Diagnosis not present

## 2019-01-09 DIAGNOSIS — I10 Essential (primary) hypertension: Secondary | ICD-10-CM | POA: Diagnosis not present

## 2019-01-09 DIAGNOSIS — I208 Other forms of angina pectoris: Secondary | ICD-10-CM | POA: Diagnosis not present

## 2019-01-09 DIAGNOSIS — E782 Mixed hyperlipidemia: Secondary | ICD-10-CM | POA: Diagnosis not present

## 2019-02-08 DIAGNOSIS — I208 Other forms of angina pectoris: Secondary | ICD-10-CM | POA: Diagnosis not present

## 2019-02-28 DIAGNOSIS — I208 Other forms of angina pectoris: Secondary | ICD-10-CM | POA: Diagnosis not present

## 2019-02-28 DIAGNOSIS — I10 Essential (primary) hypertension: Secondary | ICD-10-CM | POA: Diagnosis not present

## 2019-02-28 DIAGNOSIS — E782 Mixed hyperlipidemia: Secondary | ICD-10-CM | POA: Diagnosis not present

## 2019-02-28 DIAGNOSIS — R0602 Shortness of breath: Secondary | ICD-10-CM | POA: Diagnosis not present

## 2019-06-08 ENCOUNTER — Encounter: Payer: Self-pay | Admitting: Family Medicine

## 2019-06-08 ENCOUNTER — Ambulatory Visit: Payer: BC Managed Care – PPO | Admitting: Family Medicine

## 2019-06-08 ENCOUNTER — Other Ambulatory Visit: Payer: Self-pay

## 2019-06-08 VITALS — BP 138/90 | HR 80 | Ht 71.0 in | Wt 215.0 lb

## 2019-06-08 DIAGNOSIS — B002 Herpesviral gingivostomatitis and pharyngotonsillitis: Secondary | ICD-10-CM

## 2019-06-08 DIAGNOSIS — Z7689 Persons encountering health services in other specified circumstances: Secondary | ICD-10-CM | POA: Diagnosis not present

## 2019-06-08 DIAGNOSIS — B0089 Other herpesviral infection: Secondary | ICD-10-CM

## 2019-06-08 DIAGNOSIS — R1312 Dysphagia, oropharyngeal phase: Secondary | ICD-10-CM | POA: Diagnosis not present

## 2019-06-08 DIAGNOSIS — F17220 Nicotine dependence, chewing tobacco, uncomplicated: Secondary | ICD-10-CM

## 2019-06-08 DIAGNOSIS — R220 Localized swelling, mass and lump, head: Secondary | ICD-10-CM

## 2019-06-08 MED ORDER — VALACYCLOVIR HCL 500 MG PO TABS: 500.0000 mg | ORAL_TABLET | Freq: Two times a day (BID) | ORAL | 3 refills | Status: DC

## 2019-06-08 NOTE — Progress Notes (Signed)
Date:  06/08/2019   Name:  Derek Steele   DOB:  1960-08-28   MRN:  [id number]   Chief Complaint: Oral Pain (saw Dr Kathyrn Sheriff in 2019 for this- has not went back)  Patient is a 59 year old male who presents for a establish care exam. The patient reports the following problems: oral lesion/hx herpes. Health maintenance has been reviewed up to date.  Oral Pain  This is a chronic (recurrent episodes) problem. The current episode started more than 1 year ago. The problem has been waxing and waning. The pain is moderate. Pertinent negatives include no difficulty swallowing, facial pain, fever, oral bleeding, sinus pressure or thermal sensitivity. Treatments tried: valtrex/ ibuprofen/ The treatment provided mild relief.  Mouth Lesions  The current episode started more than 1 week ago. The problem occurs continuously. The problem has been gradually worsening. The problem is moderate. Associated symptoms include mouth sores and sore throat. Pertinent negatives include no fever, no abdominal pain, no constipation, no diarrhea, no nausea, no ear discharge, no ear pain, no headaches, no neck pain, no cough, no wheezing and no rash.    Lab Results  Component Value Date   CREATININE 0.86 03/11/2016   BUN 10 03/11/2016   NA 143 03/11/2016   K 4.5 03/11/2016   CL 102 03/11/2016   CO2 27 03/11/2016   Lab Results  Component Value Date   CHOL 214 (H) 03/11/2016   HDL 48 03/11/2016   LDLCALC 147 (H) 03/11/2016   TRIG 95 03/11/2016   CHOLHDL 4.5 03/11/2016   Lab Results  Component Value Date   TSH 1.360 12/02/2015   No results found for: HGBA1C   Review of Systems  Constitutional: Positive for chills and diaphoresis. Negative for fever.  HENT: Positive for mouth sores, sore throat and trouble swallowing. Negative for drooling, ear discharge, ear pain and sinus pressure.   Respiratory: Negative for cough, shortness of breath and wheezing.   Cardiovascular: Negative for chest pain,  palpitations and leg swelling.  Gastrointestinal: Negative for abdominal pain, blood in stool, constipation, diarrhea and nausea.  Endocrine: Negative for polydipsia.  Genitourinary: Negative for dysuria, frequency, hematuria and urgency.  Musculoskeletal: Negative for back pain, myalgias and neck pain.  Skin: Negative for rash.  Allergic/Immunologic: Negative for environmental allergies.  Neurological: Negative for dizziness and headaches.  Hematological: Does not bruise/bleed easily.  Psychiatric/Behavioral: Negative for suicidal ideas. The patient is not nervous/anxious.     Patient Active Problem List   Diagnosis Date Noted  . 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 Chewing tobacco nicotine dependence without complication 0000000  . 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 Benign neoplasm of sigmoid colon   . Benign neoplasm of descending colon   . Benign neoplasm of ascending colon   . Essential hypertension 08/29/2015  . 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 COLONOSCOPY WITH PROPOFOL N/A 10/31/2015   Procedure: COLONOSCOPY WITH PROPOFOL;  Surgeon: Lucilla Lame, MD;  Location: [facility];  Service: Endoscopy;  Laterality: N/A;  . POLYPECTOMY N/A 10/31/2015   Procedure: POLYPECTOMY;  Surgeon: Lucilla Lame, MD;  Location: [facility];  Service: Endoscopy;  Laterality: N/A;  sigmoid colon polyp x 2 cecal polyp acsending colon polyp x 2 transverse colon polyp   . REPAIR OF PERFORATED ULCER  age: mid 53's    Social History  Tobacco Use  . Smoking status: Former Research scientist (life sciences)  . Smokeless tobacco: Former Systems developer    Quit date: 04/06/1995  Substance Use Topics  . Alcohol use: No    Alcohol/week: 0.0 standard drinks    Comment: rare- holidays  . Drug use: No     Medication list has been reviewed and updated.  Current  Meds  Medication Sig  . amLODipine (NORVASC) 5 MG tablet TAKE 1 TABLET (5 MG TOTAL) BY MOUTH DAILY.  Marland Kitchen losartan (COZAAR) 100 MG tablet Take 1 tablet (100 mg total) by mouth daily. Dr Nehemiah Massed- will fill for 30 days ONLY then follow up with Dr Nehemiah Massed    [facility] 2/9 Scores 08/29/2015 06/05/2015  PHQ - 2 Score 0 0    BP Readings from Last 3 Encounters:  06/08/19 138/90  06/01/16 140/88  04/15/16 (!) 130/100    Physical Exam Vitals and nursing note reviewed.  HENT:     Head: Normocephalic.     Jaw: There is normal jaw occlusion.     Right Ear: Tympanic membrane, ear canal and external ear normal.     Left Ear: Tympanic membrane, ear canal and external ear normal.     Nose: Nose normal. No congestion or rhinorrhea.     Right Turbinates: Not enlarged.     Left Turbinates: Not enlarged.     Mouth/Throat:     Lips: Pink.     Mouth: Mucous membranes are moist.     Dentition: Abnormal dentition. Gingival swelling present.     Tongue: No lesions.     Pharynx: Posterior oropharyngeal erythema present.     Tonsils: No tonsillar exudate or tonsillar abscesses.     Comments: Ulceration left tonsilar/erythema left base tongue Eyes:     General: No scleral icterus.       Right eye: No discharge.        Left eye: No discharge.     Conjunctiva/sclera: Conjunctivae normal.     Pupils: Pupils are equal, round, and reactive to light.  Neck:     Thyroid: No thyroid mass, thyromegaly or thyroid tenderness.     Vascular: No carotid bruit, hepatojugular reflux or JVD.     Trachea: No tracheal deviation.   Cardiovascular:     Rate and Rhythm: Normal rate and regular rhythm.     Pulses: Normal pulses.     Heart sounds: Normal heart sounds. No murmur. No friction rub. No gallop.   Pulmonary:     Effort: No respiratory distress.     Breath sounds: Normal breath sounds. No wheezing or rales.  Abdominal:     General: Bowel sounds are normal.     Palpations: Abdomen is soft. There is no mass.      Tenderness: There is no abdominal tenderness. There is no guarding or rebound.  Musculoskeletal:        General: No tenderness. Normal range of motion.     Cervical back: Full passive range of motion without pain, normal range of motion and neck supple.  Lymphadenopathy:     Head:     Right side of head: Submandibular adenopathy present.     Cervical: No cervical adenopathy.     Right cervical: No superficial, deep or posterior cervical adenopathy.    Left cervical: No superficial, deep or posterior cervical adenopathy.     Comments: Vs soft mass right submandibular  Skin:    General: Skin is warm.     Findings: No rash.  Neurological:  Mental Status: He is alert and oriented to person, place, and time.     Cranial Nerves: No cranial nerve deficit.     Deep Tendon Reflexes: Reflexes are normal and symmetric.     Wt Readings from Last 3 Encounters:  06/08/19 215 lb (97.5 kg)  06/01/16 206 lb (93.4 kg)  04/15/16 209 lb (94.8 kg)    BP 138/90   Pulse 80   Ht 5\' 11"  (1.803 m)   Wt 215 lb (97.5 kg)   BMI 29.99 kg/m   Assessment and Plan:  1. Encounter to establish care Patient reestablishes with clinic for follow-up of current problem.  Patient is having recurrent ulcerations in the pharynx and oral gingiva.  2. 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 Farrel Conners noted that there was multiple areas consistent with stomatitis of a herpetic nature.  Patient had been treated with Valtrex 500 mg twice a day for 7 days and we will repeat this but in addition we will maintain him on 500 mg once a day to see if the suppression may help with the recurrence.  In the meantime we will refer back to Dr. Kristine Garbe for reevaluation because of the persistent nature. - valACYclovir (VALTREX) 500 MG tablet; Take 1 tablet (500 mg total) by mouth 2 (two) times daily. One twice a day for7 days then 1 a day  Dispense: 40 tablet; Refill: 3  3. Mass  of right submandibular region On examination patient was noted to have a soft palpable masslike area in the deep right submandibular area.  This will also be brought to the attention of Dr. Sadie Haber for evaluation. - Ambulatory referral to ENT  4. Oropharyngeal dysphagia Patient has difficulty with swallowing with both from a pain and sensation nature in the oropharyngeal area. - Ambulatory referral to ENT  5. Chewing tobacco nicotine dependence without complication Patient recently oral chewing tobacco in January but there is concerned given the nature of the recurrence of the viral infections and head neck concerns that this needs to be considered a significant risk factor and followed up on.  Patient was encouraged to continue cessation and not return to this habit.

## 2019-06-29 DIAGNOSIS — R221 Localized swelling, mass and lump, neck: Secondary | ICD-10-CM | POA: Diagnosis not present

## 2019-06-29 DIAGNOSIS — B001 Herpesviral vesicular dermatitis: Secondary | ICD-10-CM | POA: Diagnosis not present

## 2019-06-29 DIAGNOSIS — D1730 Benign lipomatous neoplasm of skin and subcutaneous tissue of unspecified sites: Secondary | ICD-10-CM | POA: Diagnosis not present

## 2019-11-12 ENCOUNTER — Telehealth: Payer: Self-pay | Admitting: Family Medicine

## 2019-11-12 NOTE — Telephone Encounter (Signed)
Called and spoke with patients wife. Informed the wife Derek Steele that she is not listed on a DPR and we are unable to speak to her about the patient legally. She understood. Asked if her husband signed a DPR would we be able to discuss things with her. I told her yes. She said he will sign the DPR and get it back to [place] quickly.   Then explained to her that right now because of Covid our office policy would not allow her to come back with him at appointments. He will need to come back himself and explain the issues he is having with the doctor alone. She said that he is not that type of person and will not say everything that is wrong with him. She asked if we could do a zoom meeting with her while she is in the room. Explained we do not do that. Told her needs to be at the visit alone. She said she will meet with Dr Ronnald Ramp at a restaurant if she has to just to discuss his problems.   Told her she can tell Dr Ronnald Ramp nurse his problems but he will still need to come back to the visit alone and discuss his problems hisself. She said he may not do it like he should alone. She would like a call back to figure out how she can discuss with "Doc Jones about my husbands problems." Per his wife he is depressed, he is angry to the point where is almost violent, he has mouth sores that are not getting better, and he has severe leg cramps so bad that she can see the muscles spasming in his leg.  Please advise.  CM

## 2019-11-12 NOTE — Telephone Encounter (Signed)
Please see if she is on something of his that we can talk to her

## 2019-11-12 NOTE — Telephone Encounter (Signed)
Told to call and schedule appt to discuss depression IF pt will be up front when asking questions in office. Needs to sign DPR when comes in

## 2019-11-12 NOTE — Telephone Encounter (Signed)
There is no dpr scanned in the system

## 2019-11-12 NOTE — Telephone Encounter (Unsigned)
Copied from [place] [id number]. Topic: General - Other >> Nov 12, 2019  9:09 AM Keene Breath wrote: Reason for CRM: Patient's wife called to speak with Baxter Flattery about setting up a consultation regarding the patient's mental health.  Please call wife at [phone]

## 2019-11-14 ENCOUNTER — Ambulatory Visit (INDEPENDENT_AMBULATORY_CARE_PROVIDER_SITE_OTHER): Payer: BC Managed Care – PPO | Admitting: Family Medicine

## 2019-11-14 ENCOUNTER — Encounter: Payer: Self-pay | Admitting: Family Medicine

## 2019-11-14 ENCOUNTER — Other Ambulatory Visit: Payer: Self-pay

## 2019-11-14 VITALS — BP 160/100 | HR 72 | Ht 71.0 in | Wt 219.0 lb

## 2019-11-14 DIAGNOSIS — R5383 Other fatigue: Secondary | ICD-10-CM | POA: Diagnosis not present

## 2019-11-14 DIAGNOSIS — G4701 Insomnia due to medical condition: Secondary | ICD-10-CM

## 2019-11-14 DIAGNOSIS — I10 Essential (primary) hypertension: Secondary | ICD-10-CM

## 2019-11-14 DIAGNOSIS — K219 Gastro-esophageal reflux disease without esophagitis: Secondary | ICD-10-CM

## 2019-11-14 DIAGNOSIS — B0089 Other herpesviral infection: Secondary | ICD-10-CM

## 2019-11-14 DIAGNOSIS — B002 Herpesviral gingivostomatitis and pharyngotonsillitis: Secondary | ICD-10-CM

## 2019-11-14 DIAGNOSIS — F4323 Adjustment disorder with mixed anxiety and depressed mood: Secondary | ICD-10-CM

## 2019-11-14 MED ORDER — PANTOPRAZOLE SODIUM 40 MG PO TBEC: 40.0000 mg | DELAYED_RELEASE_TABLET | Freq: Every day | ORAL | 3 refills | Status: AC

## 2019-11-14 MED ORDER — TRAZODONE HCL 50 MG PO TABS: 25.0000 mg | ORAL_TABLET | Freq: Every evening | ORAL | 3 refills | Status: DC | PRN

## 2019-11-14 MED ORDER — AMLODIPINE BESYLATE 10 MG PO TABS: 10.0000 mg | ORAL_TABLET | Freq: Every day | ORAL | 0 refills | Status: DC

## 2019-11-14 NOTE — Progress Notes (Signed)
Date:  11/14/2019   Name:  Derek Steele   DOB:  09/21/1960   MRN:  [id number]   Chief Complaint: Depression (PHQ9=20 and GAD7=13) and Mouth Lesions (refer back to otolaryngology at [facility]?)  Depression        This is a new problem.  The current episode started more than 1 year ago.   The onset quality is sudden.   The problem occurs intermittently.  The problem has been waxing and waning since onset.  Associated symptoms include decreased concentration, fatigue, helplessness, hopelessness, insomnia, irritable, restlessness, decreased interest, sad and suicidal ideas.  Associated symptoms include no appetite change, no body aches, no myalgias, no headaches and no indigestion.  Past treatments include nothing.  Compliance with treatment is variable.  Previous treatment provided moderate relief.  Past medical history includes thyroid problem and chronic illness.     Pertinent negatives include no anxiety. Mouth Lesions  The current episode started more than 1 week ago. Progression since onset: waxes and wanes. The problem is moderate. Associated symptoms include mouth sores. Pertinent negatives include no orthopnea, no fever, no decreased vision, no double vision, no photophobia, no abdominal pain, no constipation, no diarrhea, no nausea, no ear discharge, no ear pain, no headaches, no hearing loss, no rhinorrhea, no sore throat, no neck pain, no cough, no wheezing, no rash and no eye discharge.  Hypertension This is a chronic problem. The current episode started more than 1 year ago. The problem has been waxing and waning since onset. The problem is controlled. Pertinent negatives include no anxiety, blurred vision, chest pain, headaches, malaise/fatigue, neck pain, orthopnea, palpitations, peripheral edema, PND, shortness of breath or sweats. There are no associated agents to hypertension. Risk factors for coronary artery disease include dyslipidemia. Past treatments include angiotensin blockers. The  current treatment provides moderate improvement. There are no compliance problems.  There is no history of angina, kidney disease, CAD/MI, CVA, heart failure, left ventricular hypertrophy, PVD or retinopathy. Identifiable causes of hypertension include a thyroid problem. There is no history of chronic renal disease, a hypertension causing med or renovascular disease.  Thyroid Problem Presents for follow-up visit. Symptoms include fatigue and weight gain. Patient reports no anxiety, cold intolerance, constipation, depressed mood, diaphoresis, diarrhea, dry skin, hair loss, heat intolerance, hoarse voice, leg swelling, nail problem or palpitations. The symptoms have been stable. There is no history of heart failure.  Insomnia Primary symptoms: no fragmented sleep, no sleep disturbance, no difficulty falling asleep, no somnolence, no frequent awakening, no premature morning awakening, no malaise/fatigue, no napping.   PMH includes: depression.    Lab Results  Component Value Date   CREATININE 0.86 03/11/2016   BUN 10 03/11/2016   NA 143 03/11/2016   K 4.5 03/11/2016   CL 102 03/11/2016   CO2 27 03/11/2016   Lab Results  Component Value Date   CHOL 214 (H) 03/11/2016   HDL 48 03/11/2016   LDLCALC 147 (H) 03/11/2016   TRIG 95 03/11/2016   CHOLHDL 4.5 03/11/2016   Lab Results  Component Value Date   TSH 1.360 12/02/2015   No results found for: HGBA1C Lab Results  Component Value Date   WBC 5.0 12/02/2015   HGB 14.3 12/02/2015   HCT 43.4 12/02/2015   MCV 92 12/02/2015   PLT 273 12/02/2015   Lab Results  Component Value Date   ALT 48 (H) 12/02/2015   AST 34 12/02/2015   ALKPHOS 116 12/02/2015   BILITOT 0.7 12/02/2015  Review of Systems  Constitutional: Positive for fatigue and weight gain. Negative for appetite change, chills, diaphoresis, fever and malaise/fatigue.  HENT: Positive for mouth sores. Negative for drooling, ear discharge, ear pain, hearing loss, hoarse voice,  rhinorrhea and sore throat.   Eyes: Negative for blurred vision, double vision, photophobia and discharge.  Respiratory: Negative for cough, shortness of breath and wheezing.   Cardiovascular: Negative for chest pain, palpitations, orthopnea, leg swelling and PND.  Gastrointestinal: Negative for abdominal pain, blood in stool, constipation, diarrhea and nausea.  Endocrine: Negative for cold intolerance, heat intolerance and polydipsia.  Genitourinary: Negative for dysuria, frequency, hematuria and urgency.  Musculoskeletal: Negative for back pain, myalgias and neck pain.  Skin: Negative for rash.  Allergic/Immunologic: Negative for environmental allergies.  Neurological: Negative for dizziness and headaches.  Hematological: Does not bruise/bleed easily.  Psychiatric/Behavioral: Positive for decreased concentration, depression and suicidal ideas. Negative for sleep disturbance. The patient has insomnia. The patient is not nervous/anxious.     Patient Active Problem List   Diagnosis Date Noted  . 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 Benign neoplasm of sigmoid colon   . Benign neoplasm of descending colon   . Benign neoplasm of ascending colon   . Essential hypertension 08/29/2015  . 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 COLONOSCOPY WITH PROPOFOL N/A 10/31/2015   Procedure: COLONOSCOPY WITH PROPOFOL;  Surgeon: Lucilla Lame, MD;  Location: [place];  Service: Endoscopy;  Laterality: N/A;  . POLYPECTOMY N/A 10/31/2015   Procedure: POLYPECTOMY;  Surgeon: Lucilla Lame, MD;  Location: [place];  Service: Endoscopy;  Laterality: N/A;  sigmoid colon polyp x 2 cecal polyp acsending colon polyp x 2 transverse colon  polyp   . REPAIR OF PERFORATED ULCER  age: mid 42's    Social History   Tobacco Use  . Smoking status: Former Research scientist (life sciences)  . Smokeless tobacco: Former Systems developer    Quit date: 04/06/1995  Substance Use Topics  . Alcohol use: No    Alcohol/week: 0.0 standard drinks    Comment: rare- holidays  . Drug use: No     Medication list has been reviewed and updated.  Current Meds  Medication Sig  . amLODipine (NORVASC) 5 MG tablet TAKE 1 TABLET (5 MG TOTAL) BY MOUTH DAILY.  Marland Kitchen losartan (COZAAR) 100 MG tablet Take 1 tablet (100 mg total) by mouth daily. Dr Nehemiah Massed- will fill for 30 days ONLY then follow up with Dr Nehemiah Massed  . valACYclovir (VALTREX) 500 MG tablet Take 500 mg by mouth daily. Dr Kathyrn Sheriff     [facility] 2/9 Scores 11/14/2019 08/29/2015 06/05/2015  PHQ - 2 Score 5 0 0  PHQ- 9 Score 20 - -    GAD 7 : Generalized Anxiety Score 11/14/2019 06/08/2019  Nervous, Anxious, on Edge 3 0  Control/stop worrying 2 0  Worry too much - different things 2 0  Trouble relaxing 1 0  Restless 0 0  Easily annoyed or irritable 3 0  Afraid - awful might happen 2 0  Total GAD 7 Score 13 0  Anxiety Difficulty Extremely difficult -    BP Readings from Last 3 Encounters:  11/14/19 (!) 160/100  06/08/19 138/90  06/01/16 140/88    Physical Exam Vitals and nursing note reviewed.  Constitutional:  General: He is irritable.  HENT:     Head: Normocephalic.     Right Ear: Tympanic membrane, ear canal and external ear normal. There is no impacted cerumen.     Left Ear: Tympanic membrane, ear canal and external ear normal. There is no impacted cerumen.     Nose: Nose normal. No congestion or rhinorrhea.     Mouth/Throat:     Mouth: Mucous membranes are moist.  Eyes:     General: No scleral icterus.       Right eye: No discharge.        Left eye: No discharge.     Conjunctiva/sclera: Conjunctivae normal.     Pupils: Pupils are equal, round, and reactive to light.  Neck:     Thyroid: No thyromegaly.      Vascular: No JVD.     Trachea: No tracheal deviation.  Cardiovascular:     Rate and Rhythm: Normal rate and regular rhythm.     Heart sounds: Normal heart sounds. No murmur heard.  No friction rub. No gallop.   Pulmonary:     Effort: No respiratory distress.     Breath sounds: Normal breath sounds. No wheezing, rhonchi or rales.  Abdominal:     General: Bowel sounds are normal.     Palpations: Abdomen is soft. There is no mass.     Tenderness: There is no abdominal tenderness. There is no guarding or rebound.  Musculoskeletal:        General: No tenderness. Normal range of motion.     Cervical back: Normal range of motion and neck supple.  Lymphadenopathy:     Cervical: No cervical adenopathy.  Skin:    General: Skin is warm.     Findings: No rash.  Neurological:     General: No focal deficit present.     Mental Status: He is alert and oriented to person, place, and time.     Cranial Nerves: No cranial nerve deficit.     Deep Tendon Reflexes: Reflexes are normal and symmetric.     Wt Readings from Last 3 Encounters:  11/14/19 219 lb (99.3 kg)  06/08/19 215 lb (97.5 kg)  06/01/16 206 lb (93.4 kg)    BP (!) 160/100   Pulse 72   Ht 5\' 11"  (1.803 m)   Wt 219 lb (99.3 kg)   BMI 30.54 kg/m    Assessment and Plan: Patient presents with multiple complaints. 1. Fatigue, unspecified type Patient states that he has felt fatigued for several months and desires to have testosterone checked I have also explained to patient there is other reasons for to fatigue and that we will look at other aspects as well.  In addition to the testosterone we will be evaluating CBC CMP and thyroid panel. - Testosterone,Free and Total - traZODone (DESYREL) 50 MG tablet; Take 0.5-1 tablets (25-50 mg total) by mouth at bedtime as needed for sleep.  Dispense: 30 tablet; Refill: 3 - CBC with Differential/Platelet - Comprehensive metabolic panel - Thyroid Panel With TSH  2. Essential  hypertension Chronic.  Controlled.  Stable.  Patient is to continue amlodipine 10 mg once a day.  Patient will be evaluated by cardiology for blood pressure concerns. - Ambulatory referral to Cardiology - amLODipine (NORVASC) 10 MG tablet; Take 1 tablet (10 mg total) by mouth daily.  Dispense: 30 tablet; Refill: 0  3. 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 Kristine Garbe for gingivostomatitis.  Patient is only able to  tolerate Valtrex 500 mg daily.  Patient has persistent discomfort as well as recurrent stomatitis.  We will recheck with ENT for any further evaluation or change in therapy that may need to be made. - Ambulatory referral to ENT  4. Insomnia due to medical condition New onset.  Persistent.  Patient's had more issues with trying to initiate sleep.  We will start on trazodone 50 mg 1/2 to 1 tablet nightly.  We will refer to Psychiatry as noted below which may need to have further evaluation and treatment for this insomnia. - traZODone (DESYREL) 50 MG tablet; Take 0.5-1 tablets (25-50 mg total) by mouth at bedtime as needed for sleep.  Dispense: 30 tablet; Refill: 3 - Ambulatory referral to Psychiatry  5. Adjustment reaction with anxiety and depression Patient was given information for walk-in's with [facility] and [facility].  In the meantime it was noted that patient's PHQ was 20 with a gad score of 13.  We would rather psychiatry see initially rather than starting on medication because patient has said that he has distantly thought about harming himself but has not made plans.  We have made a referral to [facility] but as noted above we have given instructions to go to a walk-in to get to have some follow-up with psychiatry earlier. - Ambulatory referral to Psychiatry  6. Essential hypertension Chronic.  Uncontrolled.  Patient is a patient of Dr. Jose Persia but patient did not follow-up with visits as pointed out in the past.  We have reestablished an appointment with  cardiology/Kowalski for patient for this coming Monday. - Ambulatory referral to Cardiology - amLODipine (NORVASC) 10 MG tablet; Take 1 tablet (10 mg total) by mouth daily.  Dispense: 30 tablet; Refill: 0  7. Gastroesophageal reflux disease, unspecified whether esophagitis present Patient's been having some reflux issues in the back of her throat without dysphagia.  We will initiate pantoprazole 40 mg once a day. - pantoprazole (PROTONIX) 40 MG tablet; Take 1 tablet (40 mg total) by mouth daily.  Dispense: 30 tablet; Refill: 3

## 2019-11-18 LAB — CBC WITH DIFFERENTIAL/PLATELET
Basophils Absolute: 0.1 10*3/uL (ref 0.0–0.2)
Basos: 1 %
EOS (ABSOLUTE): 0.3 10*3/uL (ref 0.0–0.4)
Eos: 3 %
Hematocrit: 42.3 % (ref 37.5–51.0)
Hemoglobin: 14.3 g/dL (ref 13.0–17.7)
Immature Grans (Abs): 0 10*3/uL (ref 0.0–0.1)
Immature Granulocytes: 0 %
Lymphocytes Absolute: 1.6 10*3/uL (ref 0.7–3.1)
Lymphs: 18 %
MCH: 31.6 pg (ref 26.6–33.0)
MCHC: 33.8 g/dL (ref 31.5–35.7)
MCV: 93 fL (ref 79–97)
Monocytes Absolute: 0.7 10*3/uL (ref 0.1–0.9)
Monocytes: 8 %
Neutrophils Absolute: 6.4 10*3/uL (ref 1.4–7.0)
Neutrophils: 70 %
Platelets: 313 10*3/uL (ref 150–450)
RBC: 4.53 x10E6/uL (ref 4.14–5.80)
RDW: 12.6 % (ref 11.6–15.4)
WBC: 9.1 10*3/uL (ref 3.4–10.8)

## 2019-11-18 LAB — THYROID PANEL WITH TSH
Free Thyroxine Index: 2.1 (ref 1.2–4.9)
T3 Uptake Ratio: 26 % (ref 24–39)
T4, Total: 8 ug/dL (ref 4.5–12.0)
TSH: 1.13 u[IU]/mL (ref 0.450–4.500)

## 2019-11-18 LAB — COMPREHENSIVE METABOLIC PANEL
ALT: 26 IU/L (ref 0–44)
AST: 22 IU/L (ref 0–40)
Albumin/Globulin Ratio: 1.8 (ref 1.2–2.2)
Albumin: 4.4 g/dL (ref 3.8–4.9)
Alkaline Phosphatase: 156 IU/L — ABNORMAL HIGH (ref 48–121)
BUN/Creatinine Ratio: 15 (ref 9–20)
BUN: 13 mg/dL (ref 6–24)
Bilirubin Total: 0.7 mg/dL (ref 0.0–1.2)
CO2: 25 mmol/L (ref 20–29)
Calcium: 9.3 mg/dL (ref 8.7–10.2)
Chloride: 102 mmol/L (ref 96–106)
Creatinine, Ser: 0.87 mg/dL (ref 0.76–1.27)
GFR calc Af Amer: 110 mL/min/{1.73_m2} (ref >59)
GFR calc non Af Amer: 95 mL/min/{1.73_m2} (ref >59)
Globulin, Total: 2.5 g/dL (ref 1.5–4.5)
Glucose: 88 mg/dL (ref 65–99)
Potassium: 4.5 mmol/L (ref 3.5–5.2)
Sodium: 139 mmol/L (ref 134–144)
Total Protein: 6.9 g/dL (ref 6.0–8.5)

## 2019-11-18 LAB — TESTOSTERONE,FREE AND TOTAL
Testosterone, Free: 7.1 pg/mL — ABNORMAL LOW (ref 7.2–24.0)
Testosterone: 284 ng/dL (ref 264–916)

## 2019-11-19 DIAGNOSIS — G4733 Obstructive sleep apnea (adult) (pediatric): Secondary | ICD-10-CM | POA: Diagnosis not present

## 2019-11-19 DIAGNOSIS — I10 Essential (primary) hypertension: Secondary | ICD-10-CM | POA: Diagnosis not present

## 2019-11-19 DIAGNOSIS — R0602 Shortness of breath: Secondary | ICD-10-CM | POA: Diagnosis not present

## 2019-11-19 DIAGNOSIS — E782 Mixed hyperlipidemia: Secondary | ICD-10-CM | POA: Diagnosis not present

## 2019-11-21 ENCOUNTER — Other Ambulatory Visit: Payer: Self-pay | Admitting: Family Medicine

## 2019-11-21 DIAGNOSIS — B0089 Other herpesviral infection: Secondary | ICD-10-CM

## 2019-11-21 NOTE — Telephone Encounter (Signed)
Requested medications are due for refill today?   Unknown - historical medication.   Requested medications are on active medication list?  Yes as a historical medication.    Last Refill:  Unknown - listed as a historical medication  Future visit scheduled? Yes  Notes to Clinic:  Unable to refill historical medications.

## 2019-11-22 NOTE — Telephone Encounter (Signed)
Called pt, left a message 

## 2019-11-23 DIAGNOSIS — F329 Major depressive disorder, single episode, unspecified: Secondary | ICD-10-CM | POA: Diagnosis not present

## 2019-11-27 DIAGNOSIS — B001 Herpesviral vesicular dermatitis: Secondary | ICD-10-CM | POA: Diagnosis not present

## 2019-11-27 DIAGNOSIS — K120 Recurrent oral aphthae: Secondary | ICD-10-CM | POA: Diagnosis not present

## 2019-11-27 DIAGNOSIS — M352 Behcet's disease: Secondary | ICD-10-CM | POA: Diagnosis not present

## 2019-12-05 ENCOUNTER — Other Ambulatory Visit: Payer: Self-pay | Admitting: Family Medicine

## 2019-12-05 DIAGNOSIS — I10 Essential (primary) hypertension: Secondary | ICD-10-CM

## 2019-12-06 ENCOUNTER — Other Ambulatory Visit: Payer: Self-pay | Admitting: Family Medicine

## 2019-12-06 DIAGNOSIS — R5383 Other fatigue: Secondary | ICD-10-CM

## 2019-12-06 DIAGNOSIS — G4701 Insomnia due to medical condition: Secondary | ICD-10-CM

## 2019-12-06 NOTE — Telephone Encounter (Signed)
Requested medication (s) are due for refill today: Yes  Requested medication (s) are on the active medication list: Yes  Last refill:  11/14/19  Future visit scheduled: Yes  Notes to clinic:  Diagnosis code needed     Requested Prescriptions  Pending Prescriptions Disp Refills   traZODone (DESYREL) 50 MG tablet [Pharmacy Med Name: TRAZODONE 50 MG TABLET] 90 tablet 2    Sig: Take 0.5-1 tablets (25-50 mg total) by mouth at bedtime as needed for sleep.      Psychiatry: Antidepressants - Serotonin Modulator Passed - 12/06/2019  9:33 AM      Passed - Valid encounter within last 6 months    Recent Outpatient Visits           3 weeks ago Fatigue, unspecified type   [facility] Juline Patch, MD   6 months ago Encounter to establish care   [place], MD   3 years ago Recurrent stomatitis   [facility] Juline Patch, MD   3 years ago Need for hepatitis C screening test   [facility] Juline Patch, MD   3 years ago Essential hypertension   [place], Deanna C, MD       Future Appointments             In 2 weeks Juline Patch, MD [facility]

## 2019-12-25 DIAGNOSIS — R0602 Shortness of breath: Secondary | ICD-10-CM | POA: Diagnosis not present

## 2019-12-25 DIAGNOSIS — I208 Other forms of angina pectoris: Secondary | ICD-10-CM | POA: Diagnosis not present

## 2019-12-26 ENCOUNTER — Other Ambulatory Visit: Payer: Self-pay

## 2019-12-26 ENCOUNTER — Encounter: Payer: Self-pay | Admitting: Family Medicine

## 2019-12-26 ENCOUNTER — Ambulatory Visit: Payer: BC Managed Care – PPO | Admitting: Family Medicine

## 2019-12-26 VITALS — BP 140/88 | HR 60 | Ht 71.0 in | Wt 216.0 lb

## 2019-12-26 DIAGNOSIS — B002 Herpesviral gingivostomatitis and pharyngotonsillitis: Secondary | ICD-10-CM

## 2019-12-26 DIAGNOSIS — B0089 Other herpesviral infection: Secondary | ICD-10-CM

## 2019-12-26 DIAGNOSIS — F4323 Adjustment disorder with mixed anxiety and depressed mood: Secondary | ICD-10-CM

## 2019-12-26 DIAGNOSIS — I10 Essential (primary) hypertension: Secondary | ICD-10-CM

## 2019-12-26 DIAGNOSIS — R5383 Other fatigue: Secondary | ICD-10-CM | POA: Diagnosis not present

## 2019-12-26 NOTE — Progress Notes (Signed)
Date:  12/26/2019   Name:  Derek Steele   DOB:  05-09-1960   MRN:  [id number]   Chief Complaint: Follow-up (was sent to [facility]- started on sertraline) and Flu Vaccine  Thyroid Problem Presents for follow-up (for fatigue) visit. Patient reports no anxiety, cold intolerance, constipation, depressed mood, diaphoresis, diarrhea, dry skin, fatigue, hair loss, heat intolerance, hoarse voice, leg swelling, nail problem, palpitations, tremors, visual change, weight gain or weight loss. (Improved PHQ) The symptoms have been improving.    Lab Results  Component Value Date   CREATININE 0.87 11/14/2019   BUN 13 11/14/2019   NA 139 11/14/2019   K 4.5 11/14/2019   CL 102 11/14/2019   CO2 25 11/14/2019   Lab Results  Component Value Date   CHOL 214 (H) 03/11/2016   HDL 48 03/11/2016   LDLCALC 147 (H) 03/11/2016   TRIG 95 03/11/2016   CHOLHDL 4.5 03/11/2016   Lab Results  Component Value Date   TSH 1.130 11/14/2019   No results found for: HGBA1C Lab Results  Component Value Date   WBC 9.1 11/14/2019   HGB 14.3 11/14/2019   HCT 42.3 11/14/2019   MCV 93 11/14/2019   PLT 313 11/14/2019   Lab Results  Component Value Date   ALT 26 11/14/2019   AST 22 11/14/2019   ALKPHOS 156 (H) 11/14/2019   BILITOT 0.7 11/14/2019     Review of Systems  Constitutional: Negative for chills, diaphoresis, fatigue, fever, weight gain and weight loss.  HENT: Negative for drooling, ear discharge, ear pain, hoarse voice and sore throat.   Respiratory: Negative for cough, shortness of breath and wheezing.   Cardiovascular: Negative for chest pain, palpitations and leg swelling.  Gastrointestinal: Negative for abdominal pain, blood in stool, constipation, diarrhea and nausea.  Endocrine: Negative for cold intolerance, heat intolerance and polydipsia.  Genitourinary: Negative for dysuria, frequency, hematuria and urgency.  Musculoskeletal: Negative for back pain, myalgias and neck pain.  Skin:  Negative for rash.  Allergic/Immunologic: Negative for environmental allergies.  Neurological: Negative for dizziness, tremors and headaches.  Hematological: Does not bruise/bleed easily.  Psychiatric/Behavioral: Negative for suicidal ideas. The patient is not nervous/anxious.     Patient Active Problem List   Diagnosis Date Noted  . 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 Benign neoplasm of sigmoid colon   . Benign neoplasm of descending colon   . Benign neoplasm of ascending colon   . Essential hypertension 08/29/2015  . 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 COLONOSCOPY WITH PROPOFOL N/A 10/31/2015   Procedure: COLONOSCOPY WITH PROPOFOL;  Surgeon: Lucilla Lame, MD;  Location: [place];  Service: Endoscopy;  Laterality: N/A;  . POLYPECTOMY N/A 10/31/2015   Procedure: POLYPECTOMY;  Surgeon: Lucilla Lame, MD;  Location: [place];  Service: Endoscopy;  Laterality: N/A;  sigmoid colon polyp x 2 cecal polyp acsending colon polyp x 2 transverse colon polyp   . REPAIR OF PERFORATED ULCER  age: mid 27's    Social History   Tobacco Use  . Smoking status: Former Research scientist (life sciences)  . Smokeless tobacco: Former Systems developer    Quit date: 04/06/1995  Substance Use Topics  . Alcohol use: No    Alcohol/week: 0.0 standard drinks    Comment: rare- holidays  .  Drug use: No     Medication list has been reviewed and updated.  Current Meds  Medication Sig  . amLODipine (NORVASC) 10 MG tablet TAKE 1 TABLET BY MOUTH EVERY DAY  . losartan (COZAAR) 100 MG tablet Take 1 tablet (100 mg total) by mouth daily. Dr Nehemiah Massed- will fill for 30 days ONLY then follow up with Dr Nehemiah Massed  . pantoprazole (PROTONIX) 40 MG tablet Take 1 tablet (40 mg  total) by mouth daily.  . sertraline (ZOLOFT) 50 MG tablet Take 1 tablet by mouth daily. kraft    PHQ 2/9 Scores 12/26/2019 11/14/2019 08/29/2015 06/05/2015  PHQ - 2 Score 0 5 0 0  PHQ- 9 Score 2 20 - -    GAD 7 : Generalized Anxiety Score 12/26/2019 11/14/2019 06/08/2019  Nervous, Anxious, on Edge 0 3 0  Control/stop worrying 0 2 0  Worry too much - different things 0 2 0  Trouble relaxing 0 1 0  Restless 0 0 0  Easily annoyed or irritable 0 3 0  Afraid - awful might happen 0 2 0  Total GAD 7 Score 0 13 0  Anxiety Difficulty - Extremely difficult -    BP Readings from Last 3 Encounters:  12/26/19 140/88  11/14/19 (!) 160/100  06/08/19 138/90    Physical Exam Vitals and nursing note reviewed.  HENT:     Head: Normocephalic.     Right Ear: Tympanic membrane, ear canal and external ear normal.     Left Ear: Tympanic membrane, ear canal and external ear normal.     Nose: Nose normal. No congestion or rhinorrhea.     Mouth/Throat:     Mouth: Mucous membranes are moist.     Pharynx: Oropharynx is clear.  Eyes:     General: No scleral icterus.       Right eye: No discharge.        Left eye: No discharge.     Conjunctiva/sclera: Conjunctivae normal.     Pupils: Pupils are equal, round, and reactive to light.  Neck:     Thyroid: No thyromegaly.     Vascular: No carotid bruit or JVD.     Trachea: No tracheal deviation.  Cardiovascular:     Rate and Rhythm: Normal rate and regular rhythm.     Heart sounds: Normal heart sounds. No murmur heard.  No friction rub. No gallop.   Pulmonary:     Effort: No respiratory distress.     Breath sounds: Normal breath sounds. No wheezing, rhonchi or rales.  Abdominal:     General: Bowel sounds are normal.     Palpations: Abdomen is soft. There is no mass.     Tenderness: There is no abdominal tenderness. There is no guarding or rebound.  Musculoskeletal:        General: No tenderness. Normal range of motion.     Cervical back: Normal range  of motion and neck supple.  Lymphadenopathy:     Cervical: No cervical adenopathy.  Skin:    General: Skin is warm.     Capillary Refill: Capillary refill takes less than 2 seconds.     Findings: No rash.  Neurological:     Mental Status: He is alert and oriented to person, place, and time.     Cranial Nerves: No cranial nerve deficit.     Deep Tendon Reflexes: Reflexes are normal and symmetric.     Wt Readings from Last 3 Encounters:  12/26/19 216 lb (98 kg)  11/14/19  219 lb (99.3 kg)  06/08/19 215 lb (97.5 kg)    BP 140/88   Pulse 60   Ht 5\' 11"  (1.803 m)   Wt 216 lb (98 kg)   BMI 30.13 kg/m   Assessment and Plan: 1. Fatigue, unspecified type New onset.  Improving(per daughter and wife but patient is not so sure).  Stable.  PHQ 2 gad score 0.  Patient is to pursue a sleep study patient has been given this information as well as the sleep study site to recall him as well.  Vernard Gambles is awaiting results of his cardiology work-up.  2. 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  We went over the evaluation at [facility] which noted that he is to take Valtrex 2 g twice a day on day 1 of an outbreak followed by 1 g twice a day the day after.  Patient understands this and will be aware to start this at that time.  3. Adjustment reaction with anxiety and depression Relatively new onset.  Stable.  Gad score 2.  Patient is currently at 50 mg sertraline and we have encouraged him to continue this for the time being so there is a better adjustment to the medication.  We will recheck him in 6 months or as needed.  4. Essential hypertension Chronic.  Controlled.  Stable.  Blood pressure was noted at 140/88 but patient had not had his blood pressure medication this time and he is to continue his present regimen of amlodipine and losartan.

## 2020-01-08 DIAGNOSIS — R0602 Shortness of breath: Secondary | ICD-10-CM | POA: Diagnosis not present

## 2020-01-08 DIAGNOSIS — I10 Essential (primary) hypertension: Secondary | ICD-10-CM | POA: Diagnosis not present

## 2020-01-08 DIAGNOSIS — G4733 Obstructive sleep apnea (adult) (pediatric): Secondary | ICD-10-CM | POA: Diagnosis not present

## 2020-01-08 DIAGNOSIS — E782 Mixed hyperlipidemia: Secondary | ICD-10-CM | POA: Diagnosis not present

## 2020-01-09 ENCOUNTER — Other Ambulatory Visit: Payer: Self-pay | Admitting: Family Medicine

## 2020-01-09 DIAGNOSIS — I10 Essential (primary) hypertension: Secondary | ICD-10-CM

## 2020-01-11 DIAGNOSIS — F329 Major depressive disorder, single episode, unspecified: Secondary | ICD-10-CM | POA: Diagnosis not present

## 2020-02-03 DIAGNOSIS — G4733 Obstructive sleep apnea (adult) (pediatric): Secondary | ICD-10-CM | POA: Diagnosis not present

## 2020-02-25 DIAGNOSIS — E782 Mixed hyperlipidemia: Secondary | ICD-10-CM | POA: Diagnosis not present

## 2020-02-25 DIAGNOSIS — I10 Essential (primary) hypertension: Secondary | ICD-10-CM | POA: Diagnosis not present

## 2020-02-25 DIAGNOSIS — G4733 Obstructive sleep apnea (adult) (pediatric): Secondary | ICD-10-CM | POA: Diagnosis not present

## 2020-04-04 ENCOUNTER — Other Ambulatory Visit: Payer: Self-pay | Admitting: Family Medicine

## 2020-04-04 DIAGNOSIS — I10 Essential (primary) hypertension: Secondary | ICD-10-CM

## 2020-05-22 DIAGNOSIS — K1379 Other lesions of oral mucosa: Secondary | ICD-10-CM | POA: Diagnosis not present

## 2020-05-22 DIAGNOSIS — K121 Other forms of stomatitis: Secondary | ICD-10-CM | POA: Diagnosis not present

## 2020-05-22 DIAGNOSIS — R590 Localized enlarged lymph nodes: Secondary | ICD-10-CM | POA: Diagnosis not present

## 2020-05-22 DIAGNOSIS — E7800 Pure hypercholesterolemia, unspecified: Secondary | ICD-10-CM | POA: Diagnosis not present

## 2020-05-22 DIAGNOSIS — I10 Essential (primary) hypertension: Secondary | ICD-10-CM | POA: Diagnosis not present

## 2020-06-12 DIAGNOSIS — R5383 Other fatigue: Secondary | ICD-10-CM | POA: Diagnosis not present

## 2020-06-12 DIAGNOSIS — K121 Other forms of stomatitis: Secondary | ICD-10-CM | POA: Diagnosis not present

## 2020-06-12 DIAGNOSIS — I10 Essential (primary) hypertension: Secondary | ICD-10-CM | POA: Diagnosis not present

## 2020-06-12 DIAGNOSIS — F321 Major depressive disorder, single episode, moderate: Secondary | ICD-10-CM | POA: Diagnosis not present

## 2020-06-27 DIAGNOSIS — K121 Other forms of stomatitis: Secondary | ICD-10-CM | POA: Diagnosis not present

## 2020-07-28 DIAGNOSIS — F321 Major depressive disorder, single episode, moderate: Secondary | ICD-10-CM | POA: Diagnosis not present

## 2020-07-28 DIAGNOSIS — G4730 Sleep apnea, unspecified: Secondary | ICD-10-CM | POA: Diagnosis not present

## 2020-07-28 DIAGNOSIS — K219 Gastro-esophageal reflux disease without esophagitis: Secondary | ICD-10-CM | POA: Diagnosis not present

## 2020-07-28 DIAGNOSIS — I10 Essential (primary) hypertension: Secondary | ICD-10-CM | POA: Diagnosis not present

## 2020-09-20 DIAGNOSIS — G4733 Obstructive sleep apnea (adult) (pediatric): Secondary | ICD-10-CM | POA: Diagnosis not present

## 2020-10-20 DIAGNOSIS — G4733 Obstructive sleep apnea (adult) (pediatric): Secondary | ICD-10-CM | POA: Diagnosis not present

## 2020-10-29 DIAGNOSIS — I10 Essential (primary) hypertension: Secondary | ICD-10-CM | POA: Diagnosis not present

## 2020-10-29 DIAGNOSIS — E7800 Pure hypercholesterolemia, unspecified: Secondary | ICD-10-CM | POA: Diagnosis not present

## 2020-10-29 DIAGNOSIS — R0789 Other chest pain: Secondary | ICD-10-CM | POA: Diagnosis not present

## 2020-10-29 DIAGNOSIS — F321 Major depressive disorder, single episode, moderate: Secondary | ICD-10-CM | POA: Diagnosis not present

## 2020-10-29 DIAGNOSIS — K219 Gastro-esophageal reflux disease without esophagitis: Secondary | ICD-10-CM | POA: Diagnosis not present

## 2020-10-29 DIAGNOSIS — Z1331 Encounter for screening for depression: Secondary | ICD-10-CM | POA: Diagnosis not present

## 2020-11-20 DIAGNOSIS — G4733 Obstructive sleep apnea (adult) (pediatric): Secondary | ICD-10-CM | POA: Diagnosis not present

## 2020-12-21 DIAGNOSIS — G4733 Obstructive sleep apnea (adult) (pediatric): Secondary | ICD-10-CM | POA: Diagnosis not present

## 2020-12-26 DIAGNOSIS — E7800 Pure hypercholesterolemia, unspecified: Secondary | ICD-10-CM | POA: Diagnosis not present

## 2020-12-26 DIAGNOSIS — I10 Essential (primary) hypertension: Secondary | ICD-10-CM | POA: Diagnosis not present

## 2020-12-26 DIAGNOSIS — R5383 Other fatigue: Secondary | ICD-10-CM | POA: Diagnosis not present

## 2020-12-26 DIAGNOSIS — J069 Acute upper respiratory infection, unspecified: Secondary | ICD-10-CM | POA: Diagnosis not present

## 2021-01-20 DIAGNOSIS — G4733 Obstructive sleep apnea (adult) (pediatric): Secondary | ICD-10-CM | POA: Diagnosis not present

## 2021-02-20 DIAGNOSIS — G4733 Obstructive sleep apnea (adult) (pediatric): Secondary | ICD-10-CM | POA: Diagnosis not present

## 2021-05-04 ENCOUNTER — Telehealth: Payer: Self-pay

## 2021-05-04 NOTE — Telephone Encounter (Signed)
Pt called requesting copy of previous colonoscopy... printed and given to pt... address name and birthday verified

## 2023-06-30 ENCOUNTER — Telehealth: Payer: Self-pay

## 2023-06-30 NOTE — Telephone Encounter (Signed)
 Patients wife came storming into the office. I was on hold with [facility] to setup a peer to peer for Dr. Allegra Lai.   [name] from the surgery center sent me a message to let me know she ask the [name] to come talk with me . As [place] approached me window. I ask her to hold on one second seeing I was on hold. As I noticed she was upset. I explained that I was on hold but could listening to her.  She then take out her phone put a voicemail message up for me to read it. I ask her if she didn't mind to tell me what the voice mail said. Then she proceeds to fuss  that she had call [place] five times with no answer and that the lady down stair had call me and I didn't answer. I tried to help her but she wouldn't listen. She then starting yelling about downstairs wouldn't let her make a mammogram appointment. I explained that once I got someone to help with making an appointment for her husband I would give her information oh how to make appointment  for her mammogram.  I called [name] from my personal phone so she could help her. [name] became very upset hung up the phone started yelling that no one here in [place] knew how to help her. I tried to stop her from leaving but she said she was to upset and didn't want my help. If I figured it out for me to call her back.

## 2023-06-30 NOTE — Telephone Encounter (Signed)
 Misty Stanley from the [place] office, contacted me at the [place] office while she was on hold with an insurance company. I spoke with [name] after calling her on her mobile number, confirming it was permissible per HIPAA to continue the conversation.  During the call, I attempted to verify the patient's information and explain recent office changes, but the caller frequently interrupted. I informed her that Dr. Servando Snare is now a hospitalist and no longer performs colonoscopy procedures in [place]. However, he does have availability on Monday, Tuesday, and Thursday at the [place] location.  [name] insisted on scheduling in [place], and I reiterated that Dr. Servando Snare is unavailable at that location. I offered to pull up the schedule to explore available options, but [name] cut me off and stated I could not assist because the patient only wanted [place]. She then ended the call without allowing further discussion of alternatives.  Unable to schedule procedure
